# Patient Record
Sex: Male | Born: 1965 | Race: Black or African American | Hispanic: No | Marital: Single | Smoking: Current every day smoker
Health system: Southern US, Community
[De-identification: ages and names within clinical notes are randomized; demographics above are authoritative.]

## PROBLEM LIST (undated history)

## (undated) DIAGNOSIS — R200 Anesthesia of skin: Secondary | ICD-10-CM

## (undated) DIAGNOSIS — M5126 Other intervertebral disc displacement, lumbar region: Secondary | ICD-10-CM

## (undated) DIAGNOSIS — S12300A Unspecified displaced fracture of fourth cervical vertebra, initial encounter for closed fracture: Secondary | ICD-10-CM

## (undated) DIAGNOSIS — S72009A Fracture of unspecified part of neck of unspecified femur, initial encounter for closed fracture: Secondary | ICD-10-CM

## (undated) DIAGNOSIS — J189 Pneumonia, unspecified organism: Secondary | ICD-10-CM

## (undated) DIAGNOSIS — M545 Low back pain, unspecified: Secondary | ICD-10-CM

## (undated) DIAGNOSIS — R202 Paresthesia of skin: Secondary | ICD-10-CM

## (undated) DIAGNOSIS — M25569 Pain in unspecified knee: Secondary | ICD-10-CM

## (undated) DIAGNOSIS — G8929 Other chronic pain: Secondary | ICD-10-CM

## (undated) DIAGNOSIS — W108XXA Fall (on) (from) other stairs and steps, initial encounter: Secondary | ICD-10-CM

## (undated) HISTORY — PX: HAND TENDON SURGERY: SHX663

---

## 2003-01-06 ENCOUNTER — Emergency Department (HOSPITAL_COMMUNITY): Admission: EM | Admit: 2003-01-06 | Discharge: 2003-01-06 | Payer: Self-pay | Admitting: Emergency Medicine

## 2010-07-05 ENCOUNTER — Emergency Department (HOSPITAL_COMMUNITY): Admission: EM | Admit: 2010-07-05 | Discharge: 2010-07-05 | Payer: Self-pay | Admitting: Emergency Medicine

## 2010-07-14 ENCOUNTER — Emergency Department (HOSPITAL_COMMUNITY): Admission: EM | Admit: 2010-07-14 | Discharge: 2010-07-14 | Payer: Self-pay | Admitting: Emergency Medicine

## 2011-09-20 ENCOUNTER — Emergency Department (HOSPITAL_COMMUNITY): Payer: Self-pay

## 2011-09-20 ENCOUNTER — Emergency Department (HOSPITAL_COMMUNITY)
Admission: EM | Admit: 2011-09-20 | Discharge: 2011-09-20 | Disposition: A | Discharge status: Home / Self Care | Payer: Self-pay | Attending: Emergency Medicine | Admitting: Emergency Medicine

## 2011-09-20 ENCOUNTER — Encounter: Payer: Self-pay | Admitting: Emergency Medicine

## 2011-09-20 DIAGNOSIS — M25559 Pain in unspecified hip: Secondary | ICD-10-CM | POA: Insufficient documentation

## 2011-09-20 DIAGNOSIS — M549 Dorsalgia, unspecified: Secondary | ICD-10-CM

## 2011-09-20 DIAGNOSIS — R51 Headache: Secondary | ICD-10-CM | POA: Insufficient documentation

## 2011-09-20 DIAGNOSIS — W19XXXA Unspecified fall, initial encounter: Secondary | ICD-10-CM

## 2011-09-20 DIAGNOSIS — S0003XA Contusion of scalp, initial encounter: Secondary | ICD-10-CM | POA: Insufficient documentation

## 2011-09-20 DIAGNOSIS — T07XXXA Unspecified multiple injuries, initial encounter: Secondary | ICD-10-CM | POA: Insufficient documentation

## 2011-09-20 DIAGNOSIS — M542 Cervicalgia: Secondary | ICD-10-CM | POA: Insufficient documentation

## 2011-09-20 DIAGNOSIS — M545 Low back pain, unspecified: Secondary | ICD-10-CM | POA: Insufficient documentation

## 2011-09-20 DIAGNOSIS — S1093XA Contusion of unspecified part of neck, initial encounter: Secondary | ICD-10-CM | POA: Insufficient documentation

## 2011-09-20 DIAGNOSIS — S0093XA Contusion of unspecified part of head, initial encounter: Secondary | ICD-10-CM

## 2011-09-20 DIAGNOSIS — W1789XA Other fall from one level to another, initial encounter: Secondary | ICD-10-CM | POA: Insufficient documentation

## 2011-09-20 DIAGNOSIS — Y92009 Unspecified place in unspecified non-institutional (private) residence as the place of occurrence of the external cause: Secondary | ICD-10-CM | POA: Insufficient documentation

## 2011-09-20 DIAGNOSIS — K5730 Diverticulosis of large intestine without perforation or abscess without bleeding: Secondary | ICD-10-CM | POA: Insufficient documentation

## 2011-09-20 HISTORY — DX: Fracture of unspecified part of neck of unspecified femur, initial encounter for closed fracture: S72.009A

## 2011-09-20 LAB — POCT I-STAT, CHEM 8
BUN: 8 mg/dL (ref 6–23)
Chloride: 106 mEq/L (ref 96–112)
Glucose, Bld: 105 mg/dL — ABNORMAL HIGH (ref 70–99)
HCT: 43 % (ref 39.0–52.0)
Hemoglobin: 14.6 g/dL (ref 13.0–17.0)
Potassium: 4 mEq/L (ref 3.5–5.1)
Sodium: 144 mEq/L (ref 135–145)

## 2011-09-20 LAB — CBC
MCH: 30.8 pg (ref 26.0–34.0)
MCHC: 32.6 g/dL (ref 30.0–36.0)

## 2011-09-20 MED ORDER — ONDANSETRON HCL 4 MG/2ML IJ SOLN
4.0000 mg | Freq: Once | INTRAMUSCULAR | Status: AC
  Administered 2011-09-20: 4 mg via INTRAVENOUS
  Filled 2011-09-20: qty 2

## 2011-09-20 MED ORDER — MORPHINE SULFATE 4 MG/ML IJ SOLN
4.0000 mg | Freq: Once | INTRAMUSCULAR | Status: AC
  Administered 2011-09-20: 4 mg via INTRAVENOUS
  Filled 2011-09-20: qty 1

## 2011-09-20 MED ORDER — SODIUM CHLORIDE 0.9 % IV BOLUS (SEPSIS)
500.0000 mL | Freq: Once | INTRAVENOUS | Status: AC
  Administered 2011-09-20: 1000 mL via INTRAVENOUS

## 2011-09-20 MED ORDER — OXYCODONE-ACETAMINOPHEN 5-325 MG PO TABS: ORAL_TABLET | ORAL | Status: DC

## 2011-09-20 MED ORDER — CYCLOBENZAPRINE HCL 10 MG PO TABS
10.0000 mg | ORAL_TABLET | Freq: Once | ORAL | Status: AC
  Administered 2011-09-20: 10 mg via ORAL
  Filled 2011-09-20: qty 1

## 2011-09-20 MED ORDER — CYCLOBENZAPRINE HCL 10 MG PO TABS: 10.0000 mg | ORAL_TABLET | Freq: Three times a day (TID) | ORAL | Status: AC | PRN

## 2011-09-20 MED ORDER — OXYCODONE-ACETAMINOPHEN 5-325 MG PO TABS
2.0000 | ORAL_TABLET | Freq: Once | ORAL | Status: AC
  Administered 2011-09-20: 2 via ORAL
  Filled 2011-09-20: qty 2

## 2011-09-20 MED ORDER — IOHEXOL 300 MG/ML  SOLN
100.0000 mL | Freq: Once | INTRAMUSCULAR | Status: AC | PRN
  Administered 2011-09-20: 100 mL via INTRAVENOUS

## 2011-09-20 NOTE — ED Provider Notes (Signed)
History     CSN: [id number] Arrival date & time: 09/20/2011  2:38 PM   First MD Initiated Contact with Patient 09/20/11 1443      Chief Complaint  Patient presents with  . Fall    LSB  . Back Pain    (Consider location/radiation/quality/duration/timing/severity/associated sxs/prior treatment) HPI  The patient presents via EMS in full spine immobilization. He relates he was throwing something out of a pot and leaning over a banister on the porch and the porch railing gave way and he fell. He states he was leaning forward but as he fell he turned and he hit the right side of his head without loss of consciousness. He has pain in his right chest pelvis left hip and in his lower back. He denies numbness or tingling. He states when he tries to move his legs he gets a lot of pain in his back and in his pelvis. This happened just prior to arrival. No meds were given by EMS.   Past Medical History  Diagnosis Date  . Hip fracture     History reviewed. No pertinent past surgical history.  History reviewed. No pertinent family history.  History  Substance Use Topics  . Smoking status: Current Everyday Smoker  . Smokeless tobacco: Not on file  . Alcohol Use: Yes     weekly   patient is a Facilities manager    Review of Systems  All other systems reviewed and are negative.    Allergies  NKA  Home Medications  No current outpatient prescriptions on file.  BP 113/83  Pulse 104  Temp(Src) 97.6 F (36.4 C) (Oral)  Resp 18  Ht 6' (1.829 m)  Wt 160 lb (72.576 kg)  BMI 21.70 kg/m2  SpO2 98%  Vital signs show mild tachycardia  Physical Exam  Constitutional: He is oriented to person, place, and time. He appears well-developed and well-nourished.       Patient on a backboard which was removed during my exam. His c-collar was left in place  HENT:  Head: Normocephalic.       She has a large swelling in the right side of his head in the temporal area no laceration  or abrasion.  Eyes: Conjunctivae and EOM are normal. Pupils are equal, round, and reactive to light.  Neck:       Patient is in a c-collar.  Cardiovascular: Normal rate, regular rhythm and normal heart sounds.   Pulmonary/Chest: Effort normal and breath sounds normal. No respiratory distress. He has no wheezes. He has no rales. He exhibits tenderness.       Patient's very tender trace right rib cage area area he does not let me examine him there is no obvious abrasions or swelling. With my brief exam there is no obvious crepitance  Abdominal: Soft. Bowel sounds are normal.       Patient is very tender to stressing his pelvis he states it hurts on both sides.  Musculoskeletal:       Patient has pain in his left hip on attempted range of motion. He has no pain to palpation in his left knee lower leg ankle or foot he has no pain in his right leg however when he moves his right leg it makes him hurt in his back and pelvis. There is no external rotation or shortening of his left leg. Patient has no tenderness to palpation of the thoracic spine he does start having tenderness in the mid lumbar spine down but  gets progressively worse.  Neurological: He is alert and oriented to person, place, and time.  Skin: Skin is warm and dry.  Psychiatric: He has a normal mood and affect. His behavior is normal. Thought content normal.  PT is very painful to palpation and grabs my hands repeatedly, he also seems very painful when trying to get him off the backboard.  ED Course  Procedures (including critical care time)  Results for orders placed during the hospital encounter of 09/20/11  CBC      Component Value Range   WBC 5.2  4.0 - 10.5 (K/uL)   RBC 4.00 (*) 4.22 - 5.81 (MIL/uL)   Hemoglobin 12.3 (*) 13.0 - 17.0 (g/dL)   HCT 16.1 (*) 09.6 - 52.0 (%)   MCV 94.3  78.0 - 100.0 (fL)   MCH 30.8  26.0 - 34.0 (pg)   MCHC 32.6  30.0 - 36.0 (g/dL)   RDW 04.5 (*) 40.9 - 15.5 (%)   Platelets 221  150 - 400 (K/uL)    POCT I-STAT, CHEM 8      Component Value Range   Sodium 144  135 - 145 (mEq/L)   Potassium 4.0  3.5 - 5.1 (mEq/L)   Chloride 106  96 - 112 (mEq/L)   BUN 8  6 - 23 (mg/dL)   Creatinine, Ser 8.11  0.50 - 1.35 (mg/dL)   Glucose, Bld 914 (*) 70 - 99 (mg/dL)   Calcium, Ion 7.82 (*) 1.12 - 1.32 (mmol/L)   TCO2 22  0 - 100 (mmol/L)   Hemoglobin 14.6  13.0 - 17.0 (g/dL)   HCT 95.6  21.3 - 08.6 (%)   Laboratory impression mild anemia otherwise no significant changes  Dg Hip Complete Left  09/20/2011  *RADIOLOGY REPORT*  Clinical Data: Fall, left hip pain  LEFT HIP - COMPLETE 2+ VIEW  Comparison: None.  Findings: Three views of the left hip submitted.  No acute fracture or subluxation.  Round high density metallic foreign bodies are probable overlying the patient.  IMPRESSION: No acute fracture or subluxation.  Original Report Authenticated By: Natasha Mead, M.D.     09/20/2011  *RADIOLOGY REPORT*  Clinical Data:  45 year old male status post fall with pain.  CT CHEST, ABDOMEN AND PELVIS WITH CONTRAST  Technique:  Multidetector CT imaging of the chest, abdomen and pelvis was performed following the standard protocol during bolus administration of intravenous contrast.  Contrast: OMNIPAQUE IOHEXOL 300 MG/ML IV SOLN,  Comparison:  None.  CT CHEST  Findings:  Major airways are patent.  No pneumothorax, pleural effusion, or pericardial effusion.  Except for minor atelectasis and occasional calcified granulomas the lungs are clear.  Negative mediastinum.  Visualized major mediastinal vascular structures are within normal limits.  There is a posterior-lateral largely nondisplaced tenth rib fracture with periosteal new bone (series 3 image 51).  There is a chronic-appearing lateral left 12th rib fracture.  No acute rib fracture identified. There is a partially calcified left paracentral disc protrusion at T7-T8.  Less pronounced thoracic disc protrusions are noted elsewhere.  No thoracic vertebral fracture  sternum intact.  IMPRESSION: 1.  No acute traumatic injury identified in the chest. 2.  Subacute/partially healed right posterior tenth rib fracture. 3.  Abdomen, pelvis, and lumbar findings are below.  CT ABDOMEN AND PELVIS  Findings:  Bony pelvis appears intact.  SI joints within normal limits.  No pelvic free fluid.  Bladder distended.  Negative distal colon.  Occasional colonic diverticula.  Normal appendix.  No  dilated small bowel.  No pneumoperitoneum or free fluid in the abdomen.  The liver is remarkable for focal low attenuation adjacent to the falciform ligament measuring 12 mm (series 2 image 53).  Liver enhancement otherwise is normal.  Negative gallbladder (incidental phrygian cap), spleen, pancreas, adrenal glands, portal venous system, and major arterial structures.  Negative kidneys. Negative ureters.  Negative stomach and duodenum.  IMPRESSION: 1.  No acute traumatic injury identified in the abdomen or pelvis. Lumbar findings are below. 2.  Incidental focal hepatic steatosis adjacent to the falciform ligament.  CT LUMBAR SPINE WITH CONTRAST  Technique:  Multidetector CT imaging of the lumbar spine was performed with intravenous contrast administration.  Multiplanar CT image reconstructions were also generated.  Findings:  Bone mineralization is within normal limits.  Normal vertebral height and alignment. Bilateral posterior element alignment is within normal limits.  Sacrum intact.  Lumbar vertebrae intact.  T12-L1:  Negative.  L1-L2:  Negative.  L2-L3:  Right paracentral disc protrusion superimposed on circumferential disc bulge.  No significant spinal stenosis (AP thecal sac 10 mm).  No foraminal stenosis.  L3-L4:  Negative.  L4-L5:  Mild circumferential disc bulge.  Mild facet hypertrophy.  L5-S1:  Severe loss of disc space height and vacuum disc phenomena. Endplate sclerosis.  Circumferential disc osteophyte complex without spinal or lateral recess stenosis.  Mild facet hypertrophy. Mild to  moderate bilateral L5 foraminal stenosis primarily due to disc and endplate spurring.  IMPRESSION: 1.  No acute fracture or listhesis identified in the lumbar spine. 2.  Chronic severe L5-S1 disc degeneration with moderate bilateral L5 foraminal stenosis. 3.  L2-L3 disc degeneration including right paracentral disc protrusion without significant stenosis.  Original Report Authenticated By: Harley Hallmark, M.D.   Ct Cervical Spine Wo Contrast  09/20/2011  *RADIOLOGY REPORT*  Clinical Data:  45 year old male status post fall with posterior head and neck pain.  CT HEAD WITHOUT CONTRAST CT CERVICAL SPINE WITHOUT CONTRAST  Technique:  Multidetector CT imaging of the head and cervical spine was performed following the standard protocol without intravenous contrast.  Multiplanar CT image reconstructions of the cervical spine were also generated.  Comparison:  None.  CT HEAD  Findings: Right lateral scalp hematoma measures up to 6 mm in thickness.  Underlying calvarium intact. Visualized orbit soft tissues are within normal limits.  Mild ethmoid sinus mucosal thickening.  Other Visualized paranasal sinuses and mastoids are clear.  No acute osseous abnormality identified.  Cerebral volume is within normal limits for age.  No midline shift, ventriculomegaly, mass effect, evidence of mass lesion, intracranial hemorrhage or evidence of cortically based acute infarction.  Gray-white matter differentiation is within normal limits throughout the brain.  IMPRESSION: 1.  Right lateral scalp hematoma without underlying fracture. 2. Normal noncontrast CT appearance of the brain. 3.  Cervical findings are below.  CT CERVICAL SPINE  Findings: Lung apices are clear. Visualized paraspinal soft tissues are within normal limits.  Straightening of cervical lordosis. Visualized skull base is intact.  No atlanto-occipital dissociation.  Cervicothoracic junction alignment is within normal limits.  Bilateral posterior element alignment is  within normal limits.  Multilevel cervical disc space narrowing appears associated with circumferential disc bulges, without definite cervical spinal stenosis.  No acute cervical fracture.  IMPRESSION: No acute fracture or listhesis identified in the cervical spine. Ligamentous injury is not excluded.  Original Report Authenticated By: Harley Hallmark, M.D.    Patient was given IV fluids and IV pain medications. His c-collar was removed.  He is noted to be  ambulatory and  in no distress at time of discharge.    Diagnoses that have been ruled out:  Diagnoses that are still under consideration:  Final diagnoses:  Fall  Multiple contusions  Back pain  Contusion of head    Medications  Discharge medications  oxyCODONE-acetaminophen (PERCOCET) 5-325 MG per tablet 2 tablet (not administered)  cyclobenzaprine (FLEXERIL) tablet 10 mg (not administered)  cyclobenzaprine (FLEXERIL) 10 MG tablet (not administered)  oxyCODONE-acetaminophen (PERCOCET) 5-325 MG per tablet (not administered)    Medication given in ED  morphine 4 MG/ML injection 4 mg (4 mg Intravenous Given 09/20/11 1511)  ondansetron (ZOFRAN) injection 4 mg (4 mg Intravenous Given 09/20/11 1511)  sodium chloride 0.9 % bolus 500 mL (1000 mL Intravenous Given 09/20/11 1510)  morphine 4 MG/ML injection 4 mg (4 mg Intravenous Given 09/20/11 1614)  )    Devoria Albe, MD, Armando Gang   Ward Givens, MD 09/20/11 2130  Ward Givens, MD 09/20/11 2131

## 2011-09-20 NOTE — ED Notes (Signed)
Pt feeling little better. Transported to xray/ct at this time.

## 2011-09-20 NOTE — ED Notes (Signed)
Pt throwing pot over deck, approx 8 feet. Rail gave away and pt fell onto ground. Denies any LOC. Pt alert/oriented. Pupils perrla. Pt has tenderness to L leg from hip down and R side rib pain. Pain to head also from fall. Alert/oriented.

## 2011-09-20 NOTE — ED Provider Notes (Signed)
At time of discharge patient was noted to be ambulatory in his room in no distress. He states he was aware of the old rib fracture but he had not known for sure if he had a fracture at that time.  Ward Givens, MD 09/20/11 [phone]

## 2012-10-29 ENCOUNTER — Emergency Department (HOSPITAL_COMMUNITY): Payer: Self-pay

## 2012-10-29 ENCOUNTER — Encounter (HOSPITAL_COMMUNITY): Payer: Self-pay | Admitting: *Deleted

## 2012-10-29 ENCOUNTER — Emergency Department (HOSPITAL_COMMUNITY)
Admission: EM | Admit: 2012-10-29 | Discharge: 2012-10-29 | Payer: Self-pay | Attending: Emergency Medicine | Admitting: Emergency Medicine

## 2012-10-29 DIAGNOSIS — F17200 Nicotine dependence, unspecified, uncomplicated: Secondary | ICD-10-CM | POA: Insufficient documentation

## 2012-10-29 DIAGNOSIS — M5126 Other intervertebral disc displacement, lumbar region: Secondary | ICD-10-CM | POA: Insufficient documentation

## 2012-10-29 DIAGNOSIS — M549 Dorsalgia, unspecified: Secondary | ICD-10-CM | POA: Insufficient documentation

## 2012-10-29 HISTORY — DX: Other intervertebral disc displacement, lumbar region: M51.26

## 2012-10-29 MED ORDER — HYDROMORPHONE HCL PF 2 MG/ML IJ SOLN
2.0000 mg | Freq: Once | INTRAMUSCULAR | Status: AC
  Administered 2012-10-29: 2 mg via INTRAVENOUS
  Filled 2012-10-29: qty 1

## 2012-10-29 MED ORDER — DIAZEPAM 5 MG/ML IJ SOLN
5.0000 mg | Freq: Once | INTRAMUSCULAR | Status: AC
  Administered 2012-10-29: 5 mg via INTRAVENOUS
  Filled 2012-10-29: qty 2

## 2012-10-29 MED ORDER — HYDROMORPHONE HCL PF 1 MG/ML IJ SOLN
1.0000 mg | Freq: Once | INTRAMUSCULAR | Status: AC
  Administered 2012-10-29: 1 mg via INTRAVENOUS
  Filled 2012-10-29: qty 1

## 2012-10-29 MED ORDER — HYDROMORPHONE HCL PF 1 MG/ML IJ SOLN
1.0000 mg | Freq: Once | INTRAMUSCULAR | Status: AC
Start: 2012-10-29 — End: 2012-10-29
  Administered 2012-10-29: 1 mg via INTRAVENOUS
  Filled 2012-10-29: qty 1

## 2012-10-29 MED ORDER — OXYCODONE-ACETAMINOPHEN 5-325 MG PO TABS: 1.0000 | ORAL_TABLET | Freq: Four times a day (QID) | ORAL | Status: DC | PRN

## 2012-10-29 MED ORDER — PREDNISONE 10 MG PO TABS: 20.0000 mg | ORAL_TABLET | Freq: Two times a day (BID) | ORAL | Status: DC

## 2012-10-29 MED ORDER — KETOROLAC TROMETHAMINE 30 MG/ML IJ SOLN
30.0000 mg | Freq: Once | INTRAMUSCULAR | Status: AC
  Administered 2012-10-29: 30 mg via INTRAVENOUS
  Filled 2012-10-29: qty 1

## 2012-10-29 MED ORDER — MORPHINE SULFATE 4 MG/ML IJ SOLN
4.0000 mg | Freq: Once | INTRAMUSCULAR | Status: AC
  Administered 2012-10-29: 4 mg via INTRAVENOUS
  Filled 2012-10-29: qty 1

## 2012-10-29 NOTE — ED Notes (Signed)
Family at bedside. 

## 2012-10-29 NOTE — ED Notes (Signed)
Pt to MRI

## 2012-10-29 NOTE — ED Notes (Signed)
Pt asking for something to eat, explained to pt that he would not be allowed to have anything eat or drink until all results had been examined by Dr. Rock Nephew expressed understanding.

## 2012-10-29 NOTE — ED Notes (Addendum)
Pt was bending over to pick something up and heard a pop. Hx of back problems. Pt had Fentanyl by EMS en route.

## 2012-10-29 NOTE — ED Notes (Signed)
Xray called states that pt  Refuses to lay flat d/t pain.  Notified edp

## 2012-10-29 NOTE — ED Notes (Signed)
Patient states he does not need anything at this time. 

## 2012-10-29 NOTE — ED Provider Notes (Signed)
History   This chart was scribed for Geoffery Lyons, MD by Charolett Bumpers, ED Scribe. The patient was seen in room APA05/APA05. Patient's care was started at 1009.    CSN: [id number]  Arrival date & time 10/29/12  0944   First MD Initiated Contact with Patient 10/29/12 1009      Chief Complaint  Patient presents with  . Back Pain    The history is provided by the patient. No language interpreter was used.   BURHANUDDIN KOHLMANN is a 46 y.o. male who presents to the Emergency Department complaining of constant, severe, lower back pain with a sudden onset of this morning. He states that he leaned over at school, when he heard a popping noise in his lower back. He reports sudden movements aggravate his pain. He reports a h/o lumbar herniated disc 10 years ago with no complications since. He denies any urinary or bowel incontinence. He denies any weakness or numbness in extremities. He denies any recent increased in activity, heavy movement or injuries. He received Fentanyl 100 mcg by EMS en route.   Past Medical History  Diagnosis Date  . Hip fracture   . Lumbar herniated disc     Past Surgical History  Procedure Date  . Hand surgery     No family history on file.  History  Substance Use Topics  . Smoking status: Current Every Day Smoker  . Smokeless tobacco: Not on file  . Alcohol Use: Yes     Comment: weekly      Review of Systems  Gastrointestinal:       No bowel incontinence.   Genitourinary:       No urinary incontinence.   Musculoskeletal: Positive for back pain.  Neurological: Negative for weakness and numbness.  All other systems reviewed and are negative.    Allergies  Review of patient's allergies indicates no known allergies.  Home Medications  No current outpatient prescriptions on file.  BP 118/84  Pulse 76  Temp 98.7 F (37.1 C) (Oral)  Resp 20  SpO2 100%  Physical Exam  Nursing note and vitals reviewed. Constitutional: He is oriented  to person, place, and time. He appears well-developed and well-nourished. No distress.  HENT:  Head: Normocephalic and atraumatic.  Eyes: EOM are normal. Pupils are equal, round, and reactive to light.  Neck: Normal range of motion. Neck supple. No tracheal deviation present.  Cardiovascular: Normal rate, regular rhythm and normal heart sounds.   No murmur heard. Pulmonary/Chest: Effort normal and breath sounds normal. No respiratory distress. He has no wheezes.  Abdominal: Soft. Bowel sounds are normal. He exhibits no distension. There is no tenderness.  Musculoskeletal: Normal range of motion. He exhibits tenderness. He exhibits no edema.       Tenderness to palpation over bilaterally soft tissue in lumbar region. No bony tenderness. No step offs.  .   Neurological: He is alert and oriented to person, place, and time. He has normal reflexes.       Deep tendon reflexes are 2+ and equal in bilateral lower extremities. Strength 5/5 bilaterally in lower extremities  Skin: Skin is warm and dry.  Psychiatric: He has a normal mood and affect. His behavior is normal.    ED Course  Procedures (including critical care time)  DIAGNOSTIC STUDIES: Oxygen Saturation is 100% on room air, normal by my interpretation.    COORDINATION OF CARE:  10:33-Discussed planned course of treatment with the patient including pain medication and an x-ray  of lumbar spine, who is agreeable at this time.   10:45-Medication Orders: Ketorolac (Toradol) 30 mg/mL injection 30 mg-once; Morphine 4 mg/mL injection 4 mg-once; Diazepam (Valium) injection 5 mg-once.   Labs Reviewed - No data to display Dg Lumbar Spine Complete  10/29/2012  *RADIOLOGY REPORT*  Clinical Data: The patient's back popped.  Pain.  LUMBAR SPINE - COMPLETE 4+ VIEW  Comparison: CT abdomen and pelvis 09/20/2011.  Findings: Vertebral body height and alignment are maintained. There is loss of disc space height and endplate sclerosis at L5-S1 as seen on  the prior CT.  Paraspinous structures are unremarkable.  IMPRESSION: No acute finding.  L5-S1 degenerative disc disease.   Original Report Authenticated By: Holley Dexter, M.D.      No diagnosis found.    MDM  The patient presents with severe pain to the lower back after feeling a "pop" when he bent over to pick something up off the floor.  He came by ems because he was unable to stand and walk.  He received minimal relief with morphine, repeat doses of dilaudid, valium, and toradol.  His xrays were negative and he will have an mri done today.   This was completed and shows a small disc herniation at L2-L3 with annular tear, but nothing emergently surgical.  He will be discharged with prednisone and percocet.  He will be given the follow up information for neurosurgery.       I personally performed the services described in this documentation, which was scribed in my presence. The recorded information has been reviewed and is accurate.        Geoffery Lyons, MD 10/29/12 [phone]

## 2012-10-29 NOTE — ED Notes (Signed)
Pt back from MRI, reports some pain from movement onto the stretcher.

## 2012-12-08 ENCOUNTER — Encounter: Payer: Self-pay | Admitting: Physical Medicine & Rehabilitation

## 2012-12-24 ENCOUNTER — Ambulatory Visit (HOSPITAL_BASED_OUTPATIENT_CLINIC_OR_DEPARTMENT_OTHER): Payer: Worker's Compensation | Admitting: Physical Medicine & Rehabilitation

## 2012-12-24 ENCOUNTER — Encounter: Payer: Self-pay | Admitting: Physical Medicine & Rehabilitation

## 2012-12-24 ENCOUNTER — Encounter: Payer: Worker's Compensation | Attending: Physical Medicine & Rehabilitation

## 2012-12-24 ENCOUNTER — Telehealth: Payer: Self-pay | Admitting: *Deleted

## 2012-12-24 VITALS — BP 137/92 | HR 109 | Resp 14 | Ht 71.0 in | Wt 158.0 lb

## 2012-12-24 DIAGNOSIS — M545 Low back pain, unspecified: Secondary | ICD-10-CM

## 2012-12-24 DIAGNOSIS — M5136 Other intervertebral disc degeneration, lumbar region: Secondary | ICD-10-CM | POA: Insufficient documentation

## 2012-12-24 DIAGNOSIS — S336XXA Sprain of sacroiliac joint, initial encounter: Secondary | ICD-10-CM | POA: Insufficient documentation

## 2012-12-24 DIAGNOSIS — Z5181 Encounter for therapeutic drug level monitoring: Secondary | ICD-10-CM

## 2012-12-24 DIAGNOSIS — IMO0001 Reserved for inherently not codable concepts without codable children: Secondary | ICD-10-CM | POA: Insufficient documentation

## 2012-12-24 DIAGNOSIS — F17200 Nicotine dependence, unspecified, uncomplicated: Secondary | ICD-10-CM | POA: Insufficient documentation

## 2012-12-24 DIAGNOSIS — M51369 Other intervertebral disc degeneration, lumbar region without mention of lumbar back pain or lower extremity pain: Secondary | ICD-10-CM

## 2012-12-24 DIAGNOSIS — M5137 Other intervertebral disc degeneration, lumbosacral region: Secondary | ICD-10-CM

## 2012-12-24 MED ORDER — TRAMADOL-ACETAMINOPHEN 37.5-325 MG PO TABS: 1.0000 | ORAL_TABLET | Freq: Three times a day (TID) | ORAL | Status: DC | PRN

## 2012-12-24 MED ORDER — CYCLOBENZAPRINE HCL 5 MG PO TABS: 5.0000 mg | ORAL_TABLET | Freq: Every day | ORAL | Status: DC

## 2012-12-24 NOTE — Telephone Encounter (Signed)
Patient called wanting to know if Dr. Was going to refill his Oxycodone? He was not given a prescription during his OV today. Please advise.

## 2012-12-24 NOTE — Progress Notes (Signed)
Subjective:    Patient ID: Ross Oconnell, male    DOB: 12-16-1965, 47 y.o.   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 HPI Patient was treated and released from the emergency department on 10/29/2012.Prior history of lumbar disc 10 years ago noted by emergency department. Treated with Toradol and morphine injections as well as Valium injection. X-ray of the lumbar spine showed decreased disc height at L5-S1. No fractures. MRI also performed. Given prednisone and Percocet prescriptions MRI of the lumbar spine dated 10/29/2012 disc narrowing right paracentral extrusion L2-3. No findings at L3-4 or L4-5. Severe disc space loss L5-S1 with chronic appearing disc/osteophyte complex Next went to urgent care in [place] oh [facility]. They took him out of work until however the patient has been working Marine scientist. Also prescribed Medrol Dosepak as well as Flexeril. Currently taking ibuprofen only. Working light-duty 10 hours per week   Pain Inventory Average Pain 8 Pain Right Now 9 My pain is sharp  In the last 24 hours, has pain interfered with the following? General activity 6 Relation with others 4 Enjoyment of life 7 What TIME of day is your pain at its worst? morning and night Sleep (in general) Poor  Pain is worse with: walking, bending, sitting, standing and some activites Pain improves with: rest Relief from Meds: 8  Mobility walk without assistance how many minutes can you walk? 10 ability to climb steps?  yes do you drive?  no Do you have any goals in this area?  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  no  Physicians involved in your care Any changes since last visit?  no   Family History  Problem Relation Age of Onset  . Hypertension Mother   . Diabetes Mother    History   Social History  . Marital Status: Single    Spouse Name: N/A    Number of Children: N/A  . Years of Education: N/A   Social History Main Topics  . Smoking status: Current Every Day Smoker -- 0.5 packs/day    Types: Cigarettes  . Smokeless tobacco: Never Used  . Alcohol Use: Yes     Comment: weekly  . Drug Use: No  . Sexually Active: None   Other Topics Concern  . None   Social History Narrative  . None   Past Surgical History  Procedure Date  . Hand surgery    Past Medical History  Diagnosis Date  . Hip fracture   . Lumbar herniated disc    BP 137/92  Pulse 109  Resp 14  Ht 5\' 11"  (1.803 m)  Wt 158 lb (71.668 kg)  BMI 22.04 kg/m2  SpO2 99%    Review of Systems  Constitutional: Positive for fever, chills and diaphoresis.  Gastrointestinal: Positive for nausea.  Musculoskeletal: Positive for back pain and gait problem.  Neurological: Positive for dizziness and numbness.       Tingling, spasms   Psychiatric/Behavioral: Positive for dysphoric mood.  All other systems reviewed and are negative.       Objective:   Physical Exam  Vitals reviewed.  Constitutional: He is oriented to person, place, and time. He appears well-developed and well-nourished.  HENT:  Head: Atraumatic.  Eyes: Conjunctivae normal and EOM are normal. Pupils are equal, round, and reactive to light.  Musculoskeletal:       Lumbar back: He exhibits decreased range of motion and tenderness. He exhibits no deformity and no spasm.       Negative axial compression test Negative slump test Negative straight leg raising test Tenderness palpation starting at L4 paraspinals going down to S1 No tenderness around L2 area Range  of motion reduced to 50% No directional component  Neurological: He is alert and oriented to person, place, and time. He has normal strength and normal reflexes. He displays no atrophy. He exhibits normal muscle tone. Gait abnormal.       Move slowly but is able to go up on his toes as well as on his heels  Psychiatric: He has a normal mood and affect.    [name] is positive left PSIS      Assessment & Plan:  1. Low back and buttocks pain Exam Most consistent with sacroiliac disorder versus exacerbation of L5-S1 degenerative disc.Will refer to physical therapy for Improving range of motion,Activity tolerance as well as core stabilization. There is no pain at the L2-L3 area, do not feel that the annular tear is symptomatic even though on imaging studies it does appear to be more acute than the L5-S1 area Ultracet for pain, failing this trial of tramadol Do not think narcotic analgesics are indicated in this case May use Flexeril 5 mg at night May work with 20 pound lifting restriction , 25 pound pushing and pulling

## 2012-12-24 NOTE — Telephone Encounter (Signed)
Will not prescribe oxycodone We will use tramadol

## 2012-12-24 NOTE — Patient Instructions (Addendum)
Light duty restrictions no more than 20 pounds occasional lifting and carrying OK to push and pull 25 pounds Physical therapy 3 times per week Flexeril 5 mg at night Ultracet one tablet 3 times a day as needed for pain Recommend heating pad 30 minutes twice a day as needed

## 2012-12-28 NOTE — Telephone Encounter (Signed)
Tried to contact patient regarding oxycodone request.  Per Dr Wynn Banker he will not prescribe oxycodone and will use tramadol.

## 2013-01-07 ENCOUNTER — Ambulatory Visit: Payer: Worker's Compensation | Admitting: Physical Medicine & Rehabilitation

## 2013-01-13 ENCOUNTER — Ambulatory Visit: Payer: Worker's Compensation | Admitting: Physical Medicine & Rehabilitation

## 2013-01-28 ENCOUNTER — Ambulatory Visit: Payer: Worker's Compensation | Admitting: Physical Medicine & Rehabilitation

## 2013-02-25 ENCOUNTER — Encounter: Payer: Worker's Compensation | Attending: Physical Medicine & Rehabilitation

## 2013-02-25 ENCOUNTER — Ambulatory Visit: Payer: Worker's Compensation | Admitting: Physical Medicine & Rehabilitation

## 2013-03-20 ENCOUNTER — Emergency Department (HOSPITAL_COMMUNITY): Payer: Self-pay

## 2013-03-20 ENCOUNTER — Emergency Department (HOSPITAL_COMMUNITY)
Admission: EM | Admit: 2013-03-20 | Discharge: 2013-03-20 | Disposition: A | Discharge status: Home / Self Care | Payer: Self-pay | Attending: Emergency Medicine | Admitting: Emergency Medicine

## 2013-03-20 ENCOUNTER — Encounter (HOSPITAL_COMMUNITY): Payer: Self-pay | Admitting: *Deleted

## 2013-03-20 DIAGNOSIS — W19XXXA Unspecified fall, initial encounter: Secondary | ICD-10-CM | POA: Insufficient documentation

## 2013-03-20 DIAGNOSIS — F17200 Nicotine dependence, unspecified, uncomplicated: Secondary | ICD-10-CM | POA: Insufficient documentation

## 2013-03-20 DIAGNOSIS — S12300A Unspecified displaced fracture of fourth cervical vertebra, initial encounter for closed fracture: Secondary | ICD-10-CM

## 2013-03-20 DIAGNOSIS — Y929 Unspecified place or not applicable: Secondary | ICD-10-CM | POA: Insufficient documentation

## 2013-03-20 DIAGNOSIS — S129XXA Fracture of neck, unspecified, initial encounter: Secondary | ICD-10-CM

## 2013-03-20 DIAGNOSIS — W1809XA Striking against other object with subsequent fall, initial encounter: Secondary | ICD-10-CM | POA: Insufficient documentation

## 2013-03-20 DIAGNOSIS — Z79899 Other long term (current) drug therapy: Secondary | ICD-10-CM | POA: Insufficient documentation

## 2013-03-20 DIAGNOSIS — Z8739 Personal history of other diseases of the musculoskeletal system and connective tissue: Secondary | ICD-10-CM | POA: Insufficient documentation

## 2013-03-20 DIAGNOSIS — Z8781 Personal history of (healed) traumatic fracture: Secondary | ICD-10-CM | POA: Insufficient documentation

## 2013-03-20 DIAGNOSIS — Y9341 Activity, dancing: Secondary | ICD-10-CM | POA: Insufficient documentation

## 2013-03-20 HISTORY — DX: Unspecified displaced fracture of fourth cervical vertebra, initial encounter for closed fracture: S12.300A

## 2013-03-20 LAB — BASIC METABOLIC PANEL
BUN: 12 mg/dL (ref 6–23)
CO2: 27 mEq/L (ref 19–32)
Calcium: 8.4 mg/dL (ref 8.4–10.5)
Chloride: 106 mEq/L (ref 96–112)
Creatinine, Ser: 0.95 mg/dL (ref 0.50–1.35)

## 2013-03-20 LAB — CBC WITH DIFFERENTIAL/PLATELET
Basophils Absolute: 0 10*3/uL (ref 0.0–0.1)
Basophils Relative: 1 % (ref 0–1)
Eosinophils Absolute: 0.1 10*3/uL (ref 0.0–0.7)
HCT: 41.1 % (ref 39.0–52.0)
Hemoglobin: 13.8 g/dL (ref 13.0–17.0)
MCH: 30.3 pg (ref 26.0–34.0)
MCHC: 33.6 g/dL (ref 30.0–36.0)
Monocytes Absolute: 0.4 10*3/uL (ref 0.1–1.0)
Monocytes Relative: 8 % (ref 3–12)
Neutrophils Relative %: 45 % (ref 43–77)
RDW: 14.2 % (ref 11.5–15.5)

## 2013-03-20 MED ORDER — OXYCODONE-ACETAMINOPHEN 5-325 MG PO TABS: 1.0000 | ORAL_TABLET | Freq: Once | ORAL | Status: AC

## 2013-03-20 MED ORDER — OXYCODONE-ACETAMINOPHEN 5-325 MG PO TABS: 1.0000 | ORAL_TABLET | ORAL | Status: DC | PRN

## 2013-03-20 MED ORDER — OXYCODONE-ACETAMINOPHEN 5-325 MG PO TABS
ORAL_TABLET | ORAL | Status: AC
  Administered 2013-03-20: 1 via ORAL
  Filled 2013-03-20: qty 1

## 2013-03-20 NOTE — ED Provider Notes (Signed)
History     CSN: [id number]  Arrival date & time 03/20/13  0406   First MD Initiated Contact with Patient 03/20/13 0424      Chief Complaint  Patient presents with  . Fall  . Neck Pain    (Consider location/radiation/quality/duration/timing/severity/associated sxs/prior treatment) HPI Ross Oconnell is a 47 y.o. male brought in by ambulance, who presents to the Emergency Department complaining of neck pain after performing a "break dance" for a girlfriend. He felt his neck crack and he fell over and remained lying there for a couple of hours. He was given fentanyl by EMS. He is not in pain at this time. Denies numbness or tingling in any extremity.   Past Medical History  Diagnosis Date  . Hip fracture   . Lumbar herniated disc     Past Surgical History  Procedure Laterality Date  . Hand surgery      Family History  Problem Relation Age of Onset  . Hypertension Mother   . Diabetes Mother     History  Substance Use Topics  . Smoking status: Current Every Day Smoker -- 0.50 packs/day    Types: Cigarettes  . Smokeless tobacco: Never Used  . Alcohol Use: Yes     Comment: weekly      Review of Systems  Constitutional: Negative for fever.       10 Systems reviewed and are negative for acute change except as noted in the HPI.  HENT: Negative for congestion.   Eyes: Negative for discharge and redness.  Respiratory: Negative for cough and shortness of breath.   Cardiovascular: Negative for chest pain.  Gastrointestinal: Negative for vomiting and abdominal pain.  Musculoskeletal: Negative for back pain.       Neck pain  Skin: Negative for rash.  Neurological: Negative for syncope, numbness and headaches.  Psychiatric/Behavioral:       No behavior change.    Allergies  Review of patient's allergies indicates no known allergies.  Home Medications   Current Outpatient Rx  Name  Route  Sig  Dispense  Refill  . cyclobenzaprine (FLEXERIL) 5 MG tablet   Oral    Take 1 tablet (5 mg total) by mouth at bedtime.   30 tablet   0   . oxyCODONE-acetaminophen (PERCOCET/ROXICET) 5-325 MG per tablet   Oral   Take 1-2 tablets by mouth every 6 (six) hours as needed for pain.   25 tablet   0   . traMADol-acetaminophen (ULTRACET) 37.5-325 MG per tablet   Oral   Take 1 tablet by mouth every 8 (eight) hours as needed for pain.   90 tablet   0     BP 122/93  Pulse 97  Temp(Src) 97.8 F (36.6 C) (Oral)  Resp 18  Ht 6' (1.829 m)  Wt 160 lb (72.576 kg)  BMI 21.7 kg/m2  SpO2 97%  Physical Exam Physical examination: Vital signs and O2 SAT: Reviewed; Constitutional: Well developed, Well nourished, Well hydrated, In no acute distress; Head and Face: Normocephalic, Atraumatic No Battle's sign, no Raccoon eyes; Eyes: EOMI, PERRL, No scleral icterus; ENMT: Mouth and pharynx normal, Left TM normal, Right TM normal, Mucous membranes moist; Neck: Immobilized in C-collar, Trachea midline; Spine:  No midline CS, TS, LS tenderness.; Cardiovascular: Regular rate and rhythm, No murmur, rub, or gallop; Respiratory: Breath sounds clear & equal bilaterally, No rales, rhonchi, wheezes, or rub, Normal respiratory effort/excursion; Chest: Nontender, No deformity, Movement normal, No crepitus, No abrasions or ecchymosis.; Abdomen: Soft, Nontender,  Nondistended, Normal bowel sounds, No abrasions or ecchymosis.; Genitourinary: No CVA tenderness; Rectal: No blood at urethral meatus, no perineal hematoma, no gross rectal bleeding.; Extremities: No deformity, Full range of motion, Neurovascularly intact, Pulses normal, No tenderness, No edema, Pelvis stable; Neuro: AA&Ox3, Normal speech, GCS 15.  Major CN grossly intact.  No gross focal motor or sensory deficits in extremities.; Skin: Color normal, Warm, Dry  ED Course  Procedures (including critical care time) Results for orders placed during the hospital encounter of 03/20/13  CBC WITH DIFFERENTIAL      Result Value Range   WBC 4.9   4.0 - 10.5 K/uL   RBC 4.55  4.22 - 5.81 MIL/uL   Hemoglobin 13.8  13.0 - 17.0 g/dL   HCT 84.6  96.2 - 95.2 %   MCV 90.3  78.0 - 100.0 fL   MCH 30.3  26.0 - 34.0 pg   MCHC 33.6  30.0 - 36.0 g/dL   RDW 84.1  32.4 - 40.1 %   Platelets 186  150 - 400 K/uL   Neutrophils Relative 45  43 - 77 %   Neutro Abs 2.2  1.7 - 7.7 K/uL   Lymphocytes Relative 44  12 - 46 %   Lymphs Abs 2.2  0.7 - 4.0 K/uL   Monocytes Relative 8  3 - 12 %   Monocytes Absolute 0.4  0.1 - 1.0 K/uL   Eosinophils Relative 3  0 - 5 %   Eosinophils Absolute 0.1  0.0 - 0.7 K/uL   Basophils Relative 1  0 - 1 %   Basophils Absolute 0.0  0.0 - 0.1 K/uL    Ct Cervical Spine Wo Contrast  03/20/2013  *RADIOLOGY REPORT*  Clinical Data: The patient fell 1 hour ago.  Neck pain.  The patient is in C-collar.  CT CERVICAL SPINE WITHOUT CONTRAST  Technique:  Multidetector CT imaging of the cervical spine was performed. Multiplanar CT image reconstructions were also generated.  Comparison: CT cervical spine 09/20/2011  Findings: There is an acute fracture of the anterior inferior endplate of C4 with mild displacement and overlying soft tissue swelling. This is new since the previous study.  No involvement of the pedicles or posterior elements.  Straightening of the usual cervical lordosis which is likely due to patient positioning but ligamentous injury or muscle spasm can also have this appearance. No vertebral compression fractures.  Intervertebral disc space narrowing with endplate hypertrophic changes at C3-4, C4-5, C5-6, and C6-7 levels consistent with degenerative change.  Disc osteophyte complexes at multiple levels causing anterior effacement of the thecal sac.  Lateral masses of C1 appear symmetrical.  The odontoid process appears intact.  There is evidence of previous dental extractions.  Retention cyst in the left sphenoid sinus.  IMPRESSION: Acute appearing anterior corner fracture of the inferior endplate of C4 with overlying soft  tissue swelling.  Straightening of the usual cervical lordosis which may be due to patient positioning although ligamentous injury or muscle spasm can have this appearance.  Results were discussed by telephone with Dr. Colon Branch at 0546 hours on 03/20/2013.   Original Report Authenticated By: Burman Nieves, M.D.    Dg Cerv Spine Flex&ext Only  03/20/2013  *RADIOLOGY REPORT*  Clinical Data: Bilateral neck pain.  Anterior inferior corner fracture of C4 on CT.  CERVICAL SPINE - FLEXION AND EXTENSION VIEWS ONLY  Comparison: CT cervical spine 03/20/2013.  Findings: Acute appearing anterior inferior end plate fracture of C4 is demonstrated.  There is mild displacement  of fracture fragment and mild focal soft tissue swelling.  Changes are similar to those seen to previous CT scan.  Diffuse degenerative changes throughout cervical spine as previously identified.  No vertebral compression deformities.  Straightening of the usual cervical lordosis.  No significant change alignment between flexion and extension.  No evidence of instability.  IMPRESSION: Anterior inferior end plate fracture of C4.  Stable appearance of cervical spine alignment through flexion and extension.  No abnormal anterior subluxation to suggest instability.   Original Report Authenticated By: Burman Nieves, M.D.      [phone]  T/C from Dr. Zonia Kief, radiologist.He advised acute appearing anterior fraction of inferior endplate of C4. Suggested that these patient get MRI.  6:06 AM:  T/C to Dr. Yetta Barre, neurosurgeon case discussed, including:  HPI, pertinent PM/SHx, VS/PE, dx testing, ED course and treatment.  Asked him re MRI. He suggested we obtain flexion and extension films. If stable, place in collar for follow up with Dr. Yetta Barre in his office.   MDM  Patient with neck pain after trying to "break dance". Has a C4 endplate fracture. Stable with flexion and extension. Placed in collar. He is to follow up with Dr. Yetta Barre. Pt stable in ED with no  significant deterioration in condition.The patient appears reasonably screened and/or stabilized for discharge and I doubt any other medical condition or other [facility] requiring further screening, evaluation, or treatment in the ED at this time prior to discharge.  MDM Reviewed: nursing note and vitals Interpretation: labs, x-ray and CT scan           Nicoletta Dress. Colon Branch, MD 03/20/13 [phone]

## 2013-03-20 NOTE — ED Notes (Signed)
Pt fell while dancing against the wall. Pt laid there for about an hour then he sat up for a couple of hours denying help. Pt has had a few drinks earlier. Several months ago fell off a porch and hurt lower back and is prescribed oxycodone and flexeril.

## 2013-04-06 ENCOUNTER — Emergency Department (HOSPITAL_COMMUNITY): Payer: Self-pay

## 2013-04-06 ENCOUNTER — Encounter (HOSPITAL_COMMUNITY): Payer: Self-pay | Admitting: Emergency Medicine

## 2013-04-06 ENCOUNTER — Inpatient Hospital Stay (HOSPITAL_COMMUNITY)
Admission: EM | Admit: 2013-04-06 | Discharge: 2013-04-07 | DRG: 552 | Disposition: A | Discharge status: Home / Self Care | Payer: MEDICAID | Attending: General Surgery | Admitting: General Surgery

## 2013-04-06 DIAGNOSIS — M6281 Muscle weakness (generalized): Secondary | ICD-10-CM

## 2013-04-06 DIAGNOSIS — S239XXA Sprain of unspecified parts of thorax, initial encounter: Principal | ICD-10-CM | POA: Diagnosis present

## 2013-04-06 DIAGNOSIS — R404 Transient alteration of awareness: Secondary | ICD-10-CM | POA: Diagnosis present

## 2013-04-06 DIAGNOSIS — W108XXA Fall (on) (from) other stairs and steps, initial encounter: Secondary | ICD-10-CM

## 2013-04-06 DIAGNOSIS — F17200 Nicotine dependence, unspecified, uncomplicated: Secondary | ICD-10-CM | POA: Diagnosis present

## 2013-04-06 DIAGNOSIS — Z8781 Personal history of (healed) traumatic fracture: Secondary | ICD-10-CM

## 2013-04-06 HISTORY — DX: Unspecified displaced fracture of fourth cervical vertebra, initial encounter for closed fracture: S12.300A

## 2013-04-06 HISTORY — DX: Low back pain, unspecified: M54.50

## 2013-04-06 HISTORY — DX: Anesthesia of skin: R20.2

## 2013-04-06 HISTORY — DX: Low back pain: M54.5

## 2013-04-06 HISTORY — DX: Fall (on) (from) other stairs and steps, initial encounter: W10.8XXA

## 2013-04-06 HISTORY — DX: Pneumonia, unspecified organism: J18.9

## 2013-04-06 HISTORY — DX: Other chronic pain: G89.29

## 2013-04-06 HISTORY — DX: Paresthesia of skin: R20.0

## 2013-04-06 NOTE — ED Provider Notes (Signed)
MSE was initiated and I personally evaluated the patient and placed orders (if any) at  10:58 PM on Apr 06, 2013.  The patient appears stable so that the remainder of the MSE may be completed by another provider.  The paitnet has history of a cervial spine fracture after break dancing on 4/27.  He had another fall this evening. The patient was evaluated in another emergency room in [facility].   The patient apparently had a fall down a few stairs. He is currently complaining of pain in his back and inability to move his legs. Patient had CT scans of the head and neck as well as chest abdomen pelvis which were reportedly normal.    Dr Corliss Skains was contacted from general surgery.  Pt  Was transported for further evaluation.  On exam, pt is alert.  He complains of decreased sensation below the hips.  On exam is does have sensation in the GU region.  He complains of pain from the foley catheter.   Rectal tone is normal. No movement bilateral lower extremity.  Plan on plain films.  Dr Corliss Skains is reviewing his chart.  May need mri and possible neurosurgery consultation.   Celene Kras, MD 04/06/13 [phone]

## 2013-04-06 NOTE — H&P (Signed)
Ross Oconnell is an 47 y.o. male.   Chief Complaint: Fall/ no motor function or sensation in both lower extremities HPI: This is a 47 yo male who reportedly fell down a few steps earlier today.  Questionable loss of consciousness.  He was transported to [place], [place], where they noted that he had decreased motor function and sensation in his upper extremities, as well as absent motor function, sensation, and reflexes in both lower extremities.  He is complaining only of pain in his back.  He was seen in the ED at [facility] on 03/20/13 after break-dancing and hurting his neck.  He was diagnosed with an anterior end-plate fracture at C4.  Dr. Yetta Barre of Neurosurgery was consulted and the patient was discharged in an Aspen collar.  He decided to quit wearing the collar yesterday.    He has not been incontinent.  He actually complained of significant discomfort with Foley insertion.  He was given steroids prior to transfer.  In reviewing his medical records, he was evaluated for a fall and back pain on 10/29/12, as well as 09/20/11.    Past Medical History  Diagnosis Date  . Hip fracture   . Lumbar herniated disc     Past Surgical History  Procedure Laterality Date  . Hand surgery      Family History  Problem Relation Age of Onset  . Hypertension Mother   . Diabetes Mother    Social History:  reports that he has been smoking Cigarettes.  He has been smoking about 0.50 packs per day. He has never used smokeless tobacco. He reports that  drinks alcohol. He reports that he does not use illicit drugs. He admitted to the nurse that he had been drinking today prior to his fall, but he denied that to me.  Allergies: No Known Allergies  Prior to Admission medications   Medication Sig Start Date End Date Taking? Authorizing Provider  cyclobenzaprine (FLEXERIL) 5 MG tablet Take 1 tablet (5 mg total) by mouth at bedtime. 12/24/12   Erick Colace, MD  oxyCODONE-acetaminophen (PERCOCET/ROXICET)  5-325 MG per tablet Take 1 tablet by mouth every 4 (four) hours as needed for pain. 03/20/13   Nicoletta Dress. Colon Branch, MD  traMADol-acetaminophen (ULTRACET) 37.5-325 MG per tablet Take 1 tablet by mouth every 8 (eight) hours as needed for pain. 12/24/12   Erick Colace, MD    No results found for this or any previous visit (from the past 48 hour(s)). No results found.  Review of Systems  Eyes: Negative.   Musculoskeletal: Positive for back pain.  Neurological: Positive for weakness.  Endo/Heme/Allergies: Negative.     There were no vitals taken for this visit. Physical Exam  WDWN in NAD In hard cervical collar HEENT - no sign of external trauma/ PERRL/ EOMI Neck - non-tender; no masses or step-off Resp - CTA B CV - RRR Chest - non-tender; no palpable masses Abd - soft, non-tender Back - tender in upper lumbar spine Neuro - awake, alert Bilateral upper extremities - weak motor 2/4 bilateral; decreased sensation - unable to differentiate between dull and sharp up to the level of his shoulders Bilateral lower extremities - absent motor function; no sensation in lower extremities; absent reflexes  Studies from [facility] CT Head - no evidence of acute intracranial abnormality CT Cerv spine - negative for acute fracture CT Chest with contrast - no acute injury; possible right seventh rib fracture CT Abd/ Pelvis with contrast - no acute solid visceral  or bowel injury; no fracture seen  Labs pending  Assessment/Plan Fall - lumbar pain No radiologic sign of injury Significantly abnormal neurologic examination - lower extremities with absent motor and sensory function; weak upper extremities; pattern of neuro deficits does not fit a pattern  Plan:  Admit to Trauma service Consult Neurology to assist with neuro evaluation and further work-up.  Wilmon Arms. Corliss Skains, MD, [facility]  General/ Trauma Surgery    Fayette Hamada K. 04/06/2013, 11:34 PM

## 2013-04-06 NOTE — ED Notes (Addendum)
Pt states he fell 1-2 months ago and fractured his C4 but did not follow up. Pt fell again today into closet. Per [facility] pt cannot feel below his waste. Pt currently has 16 foley and is on 2L of o2 via nasal cannula. Pt has had CT of head down at [facility]. Pt states he has had alcohol today before falling. Pt rates pain in lower back 9/10 had of fentanyl in route. Received 1mg  of dilaudid, 4mg  of zofran and 1000mg  of prednisone at [facility].

## 2013-04-07 ENCOUNTER — Encounter (HOSPITAL_COMMUNITY): Payer: Self-pay | Admitting: General Practice

## 2013-04-07 DIAGNOSIS — R209 Unspecified disturbances of skin sensation: Secondary | ICD-10-CM

## 2013-04-07 LAB — BASIC METABOLIC PANEL
BUN: 8 mg/dL (ref 6–23)
Calcium: 8.7 mg/dL (ref 8.4–10.5)
Chloride: 101 mEq/L (ref 96–112)
Creatinine, Ser: 0.85 mg/dL (ref 0.50–1.35)
GFR calc Af Amer: >90 mL/min (ref >90)
GFR calc non Af Amer: >90 mL/min (ref >90)

## 2013-04-07 LAB — CBC WITH DIFFERENTIAL/PLATELET
Basophils Relative: 0 % (ref 0–1)
Eosinophils Absolute: 0 10*3/uL (ref 0.0–0.7)
Eosinophils Relative: 0 % (ref 0–5)
HCT: 45 % (ref 39.0–52.0)
Hemoglobin: 15.3 g/dL (ref 13.0–17.0)
MCH: 29.8 pg (ref 26.0–34.0)
MCHC: 34 g/dL (ref 30.0–36.0)
MCV: 87.7 fL (ref 78.0–100.0)
Monocytes Absolute: 0.1 10*3/uL (ref 0.1–1.0)
Monocytes Relative: 1 % — ABNORMAL LOW (ref 3–12)

## 2013-04-07 MED ORDER — ONDANSETRON HCL 4 MG/2ML IJ SOLN: 4.0000 mg | Freq: Four times a day (QID) | INTRAMUSCULAR | Status: DC | PRN

## 2013-04-07 MED ORDER — MORPHINE SULFATE 2 MG/ML IJ SOLN
2.0000 mg | INTRAMUSCULAR | Status: DC | PRN
  Administered 2013-04-07 (×4): 4 mg via INTRAVENOUS
  Filled 2013-04-07 (×5): qty 2

## 2013-04-07 MED ORDER — ONDANSETRON HCL 4 MG PO TABS: 4.0000 mg | ORAL_TABLET | Freq: Four times a day (QID) | ORAL | Status: DC | PRN

## 2013-04-07 MED ORDER — TRAMADOL HCL 50 MG PO TABS: 50.0000 mg | ORAL_TABLET | Freq: Four times a day (QID) | ORAL | Status: DC | PRN

## 2013-04-07 MED ORDER — PANTOPRAZOLE SODIUM 40 MG PO TBEC
40.0000 mg | DELAYED_RELEASE_TABLET | Freq: Every day | ORAL | Status: DC
  Administered 2013-04-07: 40 mg via ORAL
  Filled 2013-04-07: qty 1

## 2013-04-07 MED ORDER — SODIUM CHLORIDE 0.9 % IV SOLN
INTRAVENOUS | Status: DC
  Administered 2013-04-07: 02:00:00 via INTRAVENOUS

## 2013-04-07 MED ORDER — MORPHINE SULFATE 2 MG/ML IJ SOLN
2.0000 mg | Freq: Once | INTRAMUSCULAR | Status: AC
  Administered 2013-04-07: 2 mg via INTRAVENOUS
  Filled 2013-04-07: qty 1

## 2013-04-07 MED ORDER — IBUPROFEN 200 MG PO TABS: 400.0000 mg | ORAL_TABLET | Freq: Three times a day (TID) | ORAL | Status: DC | PRN

## 2013-04-07 MED ORDER — PANTOPRAZOLE SODIUM 40 MG IV SOLR
40.0000 mg | Freq: Every day | INTRAVENOUS | Status: DC
  Filled 2013-04-07: qty 40

## 2013-04-07 NOTE — Consult Note (Signed)
NEURO HOSPITALIST CONSULT NOTE    Reason for Consult: Acute paralysis with loss of sensation in both lower extremities.  HPI:                                                                                                                                          Ross Oconnell is an 47 y.o. male with a history of the hip fracture, herniated lumbar disc and recent anterior endplate fracture at C4 (03/20/2013), who suffered a fall while walking down stairs today and is complaining of pain in his neck as well as back pain and inability to move his lower extremities as well as loss of lower extremity sensation. C-spine CT scan today showed no signs of acute cervical fracture. Thoracic and lumbosacral spine x-rays show degenerative changes but no signs of acute fracture. Patient denies loss of sacral sensation, including no problems feeling insertion of his Foley catheter. He was given an Biochemist, clinical following his fracture on 03/20/2013, but decided to stop wearing the collar yesterday. Patient gives an equivocal history of alcohol consumption today. He has a history of alcohol abuse. His alcohol level on 427 2014 was 292.  Past Medical History  Diagnosis Date  . Hip fracture   . Lumbar herniated disc     Past Surgical History  Procedure Laterality Date  . Hand surgery      Family History  Problem Relation Age of Onset  . Hypertension Mother   . Diabetes Mother      Social History:  reports that he has been smoking Cigarettes.  He has been smoking about 0.50 packs per day. He has never used smokeless tobacco. He reports that  drinks alcohol. He reports that he does not use illicit drugs.  No Known Allergies  MEDICATIONS:                                                                                                                     Prior to Admission:  (Not in a hospital admission)    Blood pressure 123/83, pulse 93, temperature 97.8 F (36.6 C),  temperature source Oral, SpO2 98.00%.   Neurologic Examination:  Mental Status: Alert, oriented, in no apparent acute distress.  Speech fluent without evidence of aphasia.  Cranial Nerves: II-Visual fields were normal. III/IV/VI-Pupils were equal and reacted. Extraocular movements were full and conjugate.    V/VII-no facial numbness and no facial weakness. VIII-normal. X-normal speech. Motor: Normal strength and muscle tone of upper extremities proximally and distally. No effort was made to voluntarily move lower extremities. Muscle tone was normal in both lower extremities. Sensory: Subjective loss of sensation below the waist bilaterally. However, patient vehemently complained about penile pain associated with inadvertent movement of his Foley catheter. Deep Tendon Reflexes: 1+ and symmetrical in the upper extremities; 2+ at the knees and 1+ at the ankles, and symmetrical. Plantars: Mute bilaterally Cerebellar: Normal finger-to-nose testing.  No results found for this basename: cbc, bmp, coags, chol, tri, ldl, hga1c    No results found for this or any previous visit (from the past 48 hour(s)).  Dg Thoracic Spine W/swimmers  04/06/2013   *RADIOLOGY REPORT*  Clinical Data: Fall, low back pain.  THORACIC SPINE - 2 VIEW + SWIMMERS  Comparison: None.  Findings: Degenerative changes in the lower cervical spine and mid thoracic spine.  Normal alignment.  No fracture.  IMPRESSION: No acute bony abnormality.   Original Report Authenticated By: Charlett Nose, M.D.   Dg Lumbar Spine Complete  04/06/2013   *RADIOLOGY REPORT*  Clinical Data:  Fall, low back pain.  LUMBAR SPINE - COMPLETE 4+ VIEW  Comparison: MRI 10/29/2012.  Findings: Degenerative disc disease changes at L5-S1.  Normal alignment.  No fracture.  Degenerative facet disease at L5-S1.  SI joints are symmetric and unremarkable.  IMPRESSION:  Degenerative disc and facet disease at L5-S1.  No acute findings.   Original Report Authenticated By: Charlett Nose, M.D.    Assessment/Plan: No objective signs of acute spinal trauma clinically nor by imaging studies. Patient's presenting symptoms regarding lower extremity dysfunction appear to be psychophysiologic in origin.  Recommendations: Agree with conservative measures only at this point for management. If patient persists with lack of movement and complains of lack of sensation in lower extremities, MRI of thoracic and lumbar spine is recommended.  We will continue to follow this patient with you.  Venetia Maxon M.D. [facility] [phone]  04/07/2013, 12:04 AM

## 2013-04-07 NOTE — Progress Notes (Signed)
Patient needs a C-collar to go home.  Aspen collar.  Needs follow up with neurosurgeon as previously arranged.  This patient has been seen and I agree with the findings and treatment plan.  Marta Lamas. Gae Bon, MD, FACS [phone] (pager) [phone] (direct pager) Trauma Surgeon

## 2013-04-07 NOTE — Discharge Summary (Signed)
Physician Discharge Summary  Ross Oconnell OAC:[id number] DOB: 1966/03/23 DOA: 04/06/2013  PCP: No PCP Per Patient  Consultation: Dr. Leonette Most Stewart(Neurology)  Admit date: 04/06/2013 Discharge date: 04/07/2013  Recommendations for Outpatient Follow-up:  1.   Follow-up Information   Schedule an appointment as soon as possible for a visit with Dr. Gabriel Cirri.     Discharge Diagnoses:  1. Hx C4 fracture 2. Thoracic spine sprain   Surgical Procedure: none  Discharge Condition: stable Disposition: home with self care  Diet recommendation: regular  Filed Weights   04/07/13 0212  Weight: 149 lb 3.2 oz (67.677 kg)       Hospital Course:  Mr. Diemer was transferred from [place], [place] where he was noted to have decreased motor function and sensation to upper extremities, absent motor function and sensation to lower extremities following a fall while he was break dancing apparently.  The patient was complaining of discomfort from foley, so his complaints are inconsistent.  There was a question of loss of consciousness.  He was recently diagnosed with an anterior end plant fracture of C4.  Dr. Yetta Barre consulted for NUS at that time, he does not have a scheduled follow up.  He apparently stopped wearing c-collar yesterday.  The patient underwent a complete evaluation in [place], these records were reviewed.  He has been evaluated numerous times for back pain and falls.  His evaluation here at [facility] noted degenerative disc changes to L5-S1 without acute findings.  Neurology evaluated the patient, but did not find any objective findings and felt that perhaps his symptoms were psychophysiologic in origin.  Following this evaluation, sometimes overnight, the patient regained all motor and sensory functions.  When I entered his room, he was able to elevate, kick and even got up and walked stating he is ready to go home.  His foley was removed, he was able to void.  His VS remained stable.  The  patient was tolerating regular diet and drinking adequate fluids.  Aspen C-collar provided for the patient, I encouraged him to continue to wear until he follows up with Dr. Yetta Barre.  He may take otc analgesics for his pain along with ice/heat.  He was also provided with a prescription for tramadol which he may use as needed.  Warning signs that warrant immediate attention were reviewed with the patient.  He will be going home with his mother     Discharge Instructions     Medication List    TAKE these medications       ibuprofen 200 MG tablet  Commonly known as:  ADVIL  Take 2 tablets (400 mg total) by mouth 3 (three) times daily as needed for pain.           Follow-up Information   Schedule an appointment as soon as possible for a visit with Dr. Gabriel Cirri.       The results of significant diagnostics from this hospitalization (including imaging, microbiology, ancillary and laboratory) are listed below for reference.    Significant Diagnostic Studies: Dg Thoracic Spine W/swimmers  04/06/2013   *RADIOLOGY REPORT*  Clinical Data: Fall, low back pain.  THORACIC SPINE - 2 VIEW + SWIMMERS  Comparison: None.  Findings: Degenerative changes in the lower cervical spine and mid thoracic spine.  Normal alignment.  No fracture.  IMPRESSION: No acute bony abnormality.   Original Report Authenticated By: Charlett Nose, M.D.   Dg Lumbar Spine Complete  04/06/2013   *RADIOLOGY REPORT*  Clinical Data:  Fall, low back pain.  LUMBAR SPINE - COMPLETE 4+ VIEW  Comparison: MRI 10/29/2012.  Findings: Degenerative disc disease changes at L5-S1.  Normal alignment.  No fracture.  Degenerative facet disease at L5-S1.  SI joints are symmetric and unremarkable.  IMPRESSION: Degenerative disc and facet disease at L5-S1.  No acute findings.   Original Report Authenticated By: Charlett Nose, M.D.   Ct Cervical Spine Wo Contrast  03/20/2013   *RADIOLOGY REPORT*  Clinical Data: The patient fell 1 hour ago.   Neck pain.  The patient is in C-collar.  CT CERVICAL SPINE WITHOUT CONTRAST  Technique:  Multidetector CT imaging of the cervical spine was performed. Multiplanar CT image reconstructions were also generated.  Comparison: CT cervical spine 09/20/2011  Findings: There is an acute fracture of the anterior inferior endplate of C4 with mild displacement and overlying soft tissue swelling. This is new since the previous study.  No involvement of the pedicles or posterior elements.  Straightening of the usual cervical lordosis which is likely due to patient positioning but ligamentous injury or muscle spasm can also have this appearance. No vertebral compression fractures.  Intervertebral disc space narrowing with endplate hypertrophic changes at C3-4, C4-5, C5-6, and C6-7 levels consistent with degenerative change.  Disc osteophyte complexes at multiple levels causing anterior effacement of the thecal sac.  Lateral masses of C1 appear symmetrical.  The odontoid process appears intact.  There is evidence of previous dental extractions.  Retention cyst in the left sphenoid sinus.  IMPRESSION: Acute appearing anterior corner fracture of the inferior endplate of C4 with overlying soft tissue swelling.  Straightening of the usual cervical lordosis which may be due to patient positioning although ligamentous injury or muscle spasm can have this appearance.  Results were discussed by telephone with Dr. Colon Branch at 0546 hours on 03/20/2013.   Original Report Authenticated By: Burman Nieves, M.D.   Dg Cerv Spine Flex&ext Only  03/20/2013   *RADIOLOGY REPORT*  Clinical Data: Bilateral neck pain.  Anterior inferior corner fracture of C4 on CT.  CERVICAL SPINE - FLEXION AND EXTENSION VIEWS ONLY  Comparison: CT cervical spine 03/20/2013.  Findings: Acute appearing anterior inferior end plate fracture of C4 is demonstrated.  There is mild displacement of fracture fragment and mild focal soft tissue swelling.  Changes are similar to  those seen to previous CT scan.  Diffuse degenerative changes throughout cervical spine as previously identified.  No vertebral compression deformities.  Straightening of the usual cervical lordosis.  No significant change alignment between flexion and extension.  No evidence of instability.  IMPRESSION: Anterior inferior end plate fracture of C4.  Stable appearance of cervical spine alignment through flexion and extension.  No abnormal anterior subluxation to suggest instability.   Original Report Authenticated By: Burman Nieves, M.D.    Labs: Basic Metabolic Panel:  Recent Labs Lab 04/07/13 0010  NA 139  K 4.4  CL 101  CO2 21  GLUCOSE 114*  BUN 8  CREATININE 0.85  CALCIUM 8.7   CBC:  Recent Labs Lab 04/07/13 0010  WBC 5.8  NEUTROABS 4.9  HGB 15.3  HCT 45.0  MCV 87.7  PLT 239     Time coordinating discharge:  Signed:  Mercia Dowe, ANP-BC [phone]

## 2013-04-07 NOTE — Progress Notes (Signed)
Patient ID: Ross Oconnell, male   DOB: 1966/08/08, 47 y.o.   MRN: [id number]   LOS: 1 day   Subjective: Pt states he regained all mobility and sensation early this morning.  He is ready to go home and has a graduation today that he would like to attend.  Denies headaches, back pain resolved..  Denies paresthesias, weakness, slurred speech.  Has  Not been OOB yet, foley in place.  Objective: Vital signs in last 24 hours: Temp:  [97.4 F (36.3 C)-97.8 F (36.6 C)] 97.4 F (36.3 C) (05/15 0602) Pulse Rate:  [93-110] 95 (05/15 0602) Resp:  [16-18] 16 (05/15 0602) BP: (122-143)/(83-98) 122/90 mmHg (05/15 0602) SpO2:  [98 %-100 %] 100 % (05/15 0602) Weight:  [149 lb 3.2 oz (67.677 kg)] 149 lb 3.2 oz (67.677 kg) (05/15 0212) Last BM Date: 04/06/13  Lab Results:  CBC  Recent Labs  04/07/13 0010  WBC 5.8  HGB 15.3  HCT 45.0  PLT 239   BMET  Recent Labs  04/07/13 0010  NA 139  K 4.4  CL 101  CO2 21  GLUCOSE 114*  BUN 8  CREATININE 0.85  CALCIUM 8.7    Imaging: Dg Thoracic Spine W/swimmers  04/06/2013   *RADIOLOGY REPORT*  Clinical Data: Fall, low back pain.  THORACIC SPINE - 2 VIEW + SWIMMERS  Comparison: None.  Findings: Degenerative changes in the lower cervical spine and mid thoracic spine.  Normal alignment.  No fracture.  IMPRESSION: No acute bony abnormality.   Original Report Authenticated By: Charlett Nose, M.D.   Dg Lumbar Spine Complete  04/06/2013   *RADIOLOGY REPORT*  Clinical Data:  Fall, low back pain.  LUMBAR SPINE - COMPLETE 4+ VIEW  Comparison: MRI 10/29/2012.  Findings: Degenerative disc disease changes at L5-S1.  Normal alignment.  No fracture.  Degenerative facet disease at L5-S1.  SI joints are symmetric and unremarkable.  IMPRESSION: Degenerative disc and facet disease at L5-S1.  No acute findings.   Original Report Authenticated By: Charlett Nose, M.D.     PE:  General: WDWN in NAD  I  Neck - non-tender; no masses or step-off. Collar in place Resp  -CTA bilaterally.  No TTP CV - S1S2 RRR no murmurs, gallops or rubs.  No edema +2 bilateral distal pulses. Abd - soft, non-tender, +BS.  No masses or hernias. Back-no TTP Neuro - awake, alert and oriented.  Push and pulls are strong and equal bilaterally, sensation intact.    Assessment/Plan:  Patient Active Problem List   Diagnosis Date Noted  . Sacroiliac (ligament) sprain 12/24/2012  . Lumbar degenerative disc disease 12/24/2012   Thoracic and lumbar sprain -essentially negative exam.  His symptoms have resolved and pt is moving all extremities and ready for discharge.   DC foley and ambulate.  If able to void and does well ambulating will discharge home with PCP follow up 1-2 weeks.  Ashok Norris, ANP-BC Pager: [phone] General Trauma PA Pager: [phone]   04/07/2013

## 2013-07-09 ENCOUNTER — Emergency Department: Payer: Self-pay | Admitting: Emergency Medicine

## 2013-07-09 LAB — CBC
HCT: 38.8 % — ABNORMAL LOW (ref 40.0–52.0)
HGB: 13.4 g/dL (ref 13.0–18.0)
MCH: 31 pg (ref 26.0–34.0)
MCHC: 34.4 g/dL (ref 32.0–36.0)
MCV: 90 fL (ref 80–100)
RBC: 4.31 10*6/uL — ABNORMAL LOW (ref 4.40–5.90)
RDW: 16.2 % — ABNORMAL HIGH (ref 11.5–14.5)
WBC: 6.7 10*3/uL (ref 3.8–10.6)

## 2013-07-09 LAB — COMPREHENSIVE METABOLIC PANEL
Albumin: 3.6 g/dL (ref 3.4–5.0)
Alkaline Phosphatase: 85 U/L (ref 50–136)
Anion Gap: 6 — ABNORMAL LOW (ref 7–16)
BUN: 14 mg/dL (ref 7–18)
Calcium, Total: 8.4 mg/dL — ABNORMAL LOW (ref 8.5–10.1)
Chloride: 107 mmol/L (ref 98–107)
Creatinine: 0.94 mg/dL (ref 0.60–1.30)
EGFR (Non-African Amer.): >60
Osmolality: 279 (ref 275–301)
Sodium: 140 mmol/L (ref 136–145)
Total Protein: 7.2 g/dL (ref 6.4–8.2)

## 2013-07-09 LAB — DRUG SCREEN, URINE
Amphetamines, Ur Screen: NEGATIVE (ref ?–1000)
Barbiturates, Ur Screen: NEGATIVE (ref ?–200)
Cocaine Metabolite,Ur ~~LOC~~: NEGATIVE (ref ?–300)
MDMA (Ecstasy)Ur Screen: NEGATIVE (ref ?–500)
Phencyclidine (PCP) Ur S: NEGATIVE (ref ?–25)
Tricyclic, Ur Screen: NEGATIVE (ref ?–1000)

## 2013-07-09 LAB — ETHANOL: Ethanol %: 0.319 % (ref 0.000–0.080)

## 2013-07-20 ENCOUNTER — Encounter (HOSPITAL_COMMUNITY): Payer: Self-pay | Admitting: *Deleted

## 2013-07-20 ENCOUNTER — Emergency Department (HOSPITAL_COMMUNITY)
Admission: EM | Admit: 2013-07-20 | Discharge: 2013-07-21 | Disposition: A | Discharge status: Home / Self Care | Payer: Self-pay | Attending: Emergency Medicine | Admitting: Emergency Medicine

## 2013-07-20 DIAGNOSIS — G8929 Other chronic pain: Secondary | ICD-10-CM | POA: Insufficient documentation

## 2013-07-20 DIAGNOSIS — Z8701 Personal history of pneumonia (recurrent): Secondary | ICD-10-CM | POA: Insufficient documentation

## 2013-07-20 DIAGNOSIS — F17200 Nicotine dependence, unspecified, uncomplicated: Secondary | ICD-10-CM | POA: Insufficient documentation

## 2013-07-20 DIAGNOSIS — S0990XA Unspecified injury of head, initial encounter: Secondary | ICD-10-CM | POA: Insufficient documentation

## 2013-07-20 DIAGNOSIS — S161XXA Strain of muscle, fascia and tendon at neck level, initial encounter: Secondary | ICD-10-CM

## 2013-07-20 DIAGNOSIS — Z8781 Personal history of (healed) traumatic fracture: Secondary | ICD-10-CM | POA: Insufficient documentation

## 2013-07-20 DIAGNOSIS — R000 Tachycardia, unspecified: Secondary | ICD-10-CM | POA: Insufficient documentation

## 2013-07-20 DIAGNOSIS — IMO0002 Reserved for concepts with insufficient information to code with codable children: Secondary | ICD-10-CM | POA: Insufficient documentation

## 2013-07-20 DIAGNOSIS — S0031XA Abrasion of nose, initial encounter: Secondary | ICD-10-CM

## 2013-07-20 DIAGNOSIS — M549 Dorsalgia, unspecified: Secondary | ICD-10-CM

## 2013-07-20 DIAGNOSIS — Y929 Unspecified place or not applicable: Secondary | ICD-10-CM | POA: Insufficient documentation

## 2013-07-20 DIAGNOSIS — S139XXA Sprain of joints and ligaments of unspecified parts of neck, initial encounter: Secondary | ICD-10-CM | POA: Insufficient documentation

## 2013-07-20 DIAGNOSIS — Z8739 Personal history of other diseases of the musculoskeletal system and connective tissue: Secondary | ICD-10-CM | POA: Insufficient documentation

## 2013-07-20 DIAGNOSIS — W19XXXA Unspecified fall, initial encounter: Secondary | ICD-10-CM | POA: Insufficient documentation

## 2013-07-20 DIAGNOSIS — Y939 Activity, unspecified: Secondary | ICD-10-CM | POA: Insufficient documentation

## 2013-07-20 MED ORDER — MORPHINE SULFATE 4 MG/ML IJ SOLN
8.0000 mg | Freq: Once | INTRAMUSCULAR | Status: AC
  Administered 2013-07-21: 8 mg via INTRAMUSCULAR
  Filled 2013-07-20: qty 2

## 2013-07-20 MED ORDER — ONDANSETRON 4 MG PO TBDP
4.0000 mg | ORAL_TABLET | Freq: Once | ORAL | Status: AC
  Administered 2013-07-21: 4 mg via ORAL
  Filled 2013-07-20: qty 1

## 2013-07-21 ENCOUNTER — Emergency Department (HOSPITAL_COMMUNITY): Payer: Self-pay

## 2013-07-21 MED ORDER — OXYCODONE-ACETAMINOPHEN 5-325 MG PO TABS: 1.0000 | ORAL_TABLET | ORAL | Status: DC | PRN

## 2013-07-21 MED ORDER — TETANUS-DIPHTH-ACELL PERTUSSIS 5-2.5-18.5 LF-MCG/0.5 IM SUSP: 0.5000 mL | Freq: Once | INTRAMUSCULAR | Status: DC

## 2013-07-21 NOTE — ED Provider Notes (Signed)
CSN: [id number]     Arrival date & time 07/20/13  2238 History   First MD Initiated Contact with Patient 07/20/13 2317     Chief Complaint  Patient presents with  . Fall    .fell on brick ornament in the yard and abrasion to nose and numbness to left arm  . Back Pain  . Neck Injury    has passed hx of c4 injury and herniated disk per patient   (Consider location/radiation/quality/duration/timing/severity/associated sxs/prior Treatment) Patient is a 47 y.o. male presenting with fall, back pain, and neck injury. The history is provided by the patient.  Fall This is a new problem. The current episode started today. The problem has been gradually worsening. Associated symptoms include arthralgias, headaches, neck pain and numbness. Pertinent negatives include no abdominal pain, chest pain or coughing. Associated symptoms comments: Facial abrasions Back pain.. Nothing aggravates the symptoms. He has tried nothing for the symptoms. The treatment provided no relief.  Back Pain Associated symptoms: headaches and numbness   Associated symptoms: no abdominal pain, no chest pain and no dysuria   Neck Injury Associated symptoms include arthralgias, headaches, neck pain and numbness. Pertinent negatives include no abdominal pain, chest pain or coughing.    Past Medical History  Diagnosis Date  . Hip fracture ?1969    "don't remember which side" (04/07/2013)  . Lumbar herniated disc   . Numbness and tingling of both legs     "sometimes" (04/07/2013)  . Chronic lower back pain   . Pneumonia fall 2013  . C4 cervical fracture 03/20/2013    [name] 04/06/2013 (04/07/2013)  . Fall down steps 04/06/2013   Past Surgical History  Procedure Laterality Date  . Hand tendon surgery Right ~ 2011    "had tendons reattached" (04/07/2013)   Family History  Problem Relation Age of Onset  . Hypertension Mother   . Diabetes Mother    History  Substance Use Topics  . Smoking status: Current Every Day Smoker --  0.33 packs/day for 17 years    Types: Cigarettes  . Smokeless tobacco: Never Used     Comment: 04/07/2013 offered smoking cessation materials; pt declines  . Alcohol Use: 1.2 oz/week    2 Cans of beer per week     Comment: 04/07/2013 "one weekend day/week"    Review of Systems  Constitutional: Negative for activity change.       All ROS Neg except as noted in HPI  HENT: Positive for neck pain. Negative for nosebleeds.   Eyes: Negative for photophobia and discharge.  Respiratory: Negative for cough, shortness of breath and wheezing.   Cardiovascular: Negative for chest pain and palpitations.  Gastrointestinal: Negative for abdominal pain and blood in stool.  Genitourinary: Negative for dysuria, frequency and hematuria.  Musculoskeletal: Positive for back pain and arthralgias.  Skin: Negative.   Neurological: Positive for numbness and headaches. Negative for dizziness, seizures and speech difficulty.  Psychiatric/Behavioral: Negative for hallucinations and confusion.    Allergies  Review of patient's allergies indicates no known allergies.  Home Medications   Current Outpatient Rx  Name  Route  Sig  Dispense  Refill  . ibuprofen (ADVIL) 200 MG tablet   Oral   Take 2 tablets (400 mg total) by mouth 3 (three) times daily as needed for pain.   30 tablet   0   . traMADol (ULTRAM) 50 MG tablet   Oral   Take 1 tablet (50 mg total) by mouth every 6 (six) hours as needed  for pain.   50 tablet   0    BP 137/94  Pulse 109  Temp(Src) 98.1 F (36.7 C) (Oral)  Resp 20  Ht 6' (1.829 m)  Wt 160 lb (72.576 kg)  BMI 21.7 kg/m2  SpO2 96% Physical Exam  Nursing note and vitals reviewed. Constitutional: He is oriented to person, place, and time. He appears well-developed and well-nourished.  Non-toxic appearance.  HENT:  Head: Normocephalic.  Right Ear: Tympanic membrane and external ear normal.  Left Ear: Tympanic membrane and external ear normal.  Nose:    Smell of alcohol  present.  Eyes: EOM and lids are normal. Pupils are equal, round, and reactive to light.  Neck: Spinous process tenderness present. Carotid bruit is not present.  Cervical collar in place.  Cardiovascular: Regular rhythm, normal heart sounds, intact distal pulses and normal pulses.  Tachycardia present.   Pulmonary/Chest: Breath sounds normal. No respiratory distress.  Left lower rib area tenderness.  Abdominal: Soft. Bowel sounds are normal. There is no tenderness. There is no guarding.  Musculoskeletal: Normal range of motion.       Lumbar back: He exhibits tenderness and pain.       Right upper leg: Normal.       Left upper leg: Normal.  Back pain with movement of the left leg.  Lymphadenopathy:       Head (right side): No submandibular adenopathy present.       Head (left side): No submandibular adenopathy present.    He has no cervical adenopathy.  Neurological: He is alert and oriented to person, place, and time. He has normal strength. No cranial nerve deficit or sensory deficit.  Skin: Skin is warm and dry.  Psychiatric: He has a normal mood and affect. His speech is normal.    ED Course  Procedures (including critical care time) Labs Review Labs Reviewed - No data to display Imaging Review No results found. Pulse ox 96% on room air. WNL by my interpretation. MDM  No diagnosis found. **I have reviewed nursing notes, vital signs, and all appropriate lab and imaging results for this patient.*  Pt removed from LSB by me.  Rib xrays are negative for new fractures. Chest xray is non-acute. The L spine xray is neg for new fracture. DDD at L5 unchanged. 1:06am pt talking on cell phone. No acute distress. Pt request more pain med because back is still hurting . CT head and c spine pending. Pt's care continued by Dr Bebe Shaggy. 1:16am.  Kathie Dike, PA-C 07/21/13 0211

## 2013-07-21 NOTE — ED Provider Notes (Signed)
Medical screening examination/treatment/procedure(s) were conducted as a shared visit with non-physician practitioner(s) and myself.  I personally evaluated the patient during the encounter  Imaging negative for acute fracture, but given previous c-spine fx, will place in aspen collar and advised f/u with nsgy.  He is ambulatory and no focal weakness noted No abdominal tenderness noted He has abrasion to nose, but no deformity, no septal hematoma, and no other facial injury noted   Joya Gaskins, MD 07/21/13 [phone]

## 2013-10-25 DIAGNOSIS — Y9289 Other specified places as the place of occurrence of the external cause: Secondary | ICD-10-CM | POA: Insufficient documentation

## 2013-10-25 DIAGNOSIS — F17200 Nicotine dependence, unspecified, uncomplicated: Secondary | ICD-10-CM | POA: Insufficient documentation

## 2013-10-25 DIAGNOSIS — Y9389 Activity, other specified: Secondary | ICD-10-CM | POA: Insufficient documentation

## 2013-10-25 DIAGNOSIS — W2209XA Striking against other stationary object, initial encounter: Secondary | ICD-10-CM | POA: Insufficient documentation

## 2013-10-25 DIAGNOSIS — S92919A Unspecified fracture of unspecified toe(s), initial encounter for closed fracture: Secondary | ICD-10-CM | POA: Insufficient documentation

## 2013-10-26 ENCOUNTER — Emergency Department (HOSPITAL_COMMUNITY)
Admission: EM | Admit: 2013-10-26 | Discharge: 2013-10-26 | Disposition: A | Discharge status: Home / Self Care | Payer: Self-pay | Attending: Emergency Medicine | Admitting: Emergency Medicine

## 2013-10-26 ENCOUNTER — Encounter (HOSPITAL_COMMUNITY): Payer: Self-pay | Admitting: Emergency Medicine

## 2013-10-26 ENCOUNTER — Emergency Department (HOSPITAL_COMMUNITY): Payer: Self-pay

## 2013-10-26 DIAGNOSIS — S92912A Unspecified fracture of left toe(s), initial encounter for closed fracture: Secondary | ICD-10-CM

## 2013-10-26 MED ORDER — IBUPROFEN 800 MG PO TABS
800.0000 mg | ORAL_TABLET | Freq: Once | ORAL | Status: AC
  Administered 2013-10-26: 800 mg via ORAL
  Filled 2013-10-26: qty 1

## 2013-10-26 MED ORDER — HYDROCODONE-ACETAMINOPHEN 5-325 MG PO TABS: 1.0000 | ORAL_TABLET | Freq: Four times a day (QID) | ORAL | Status: DC | PRN

## 2013-10-26 MED ORDER — HYDROCODONE-ACETAMINOPHEN 5-325 MG PO TABS
1.0000 | ORAL_TABLET | Freq: Once | ORAL | Status: AC
  Administered 2013-10-26: 1 via ORAL
  Filled 2013-10-26: qty 1

## 2013-10-26 MED ORDER — IBUPROFEN 800 MG PO TABS: 800.0000 mg | ORAL_TABLET | Freq: Three times a day (TID) | ORAL | Status: DC

## 2013-10-26 NOTE — ED Provider Notes (Signed)
Medical screening examination/treatment/procedure(s) were performed by non-physician practitioner and as supervising physician I was immediately available for consultation/collaboration.  EKG Interpretation   None       Devoria Albe, MD, Armando Gang   Ward Givens, MD 10/26/13 [phone]

## 2013-10-26 NOTE — ED Provider Notes (Signed)
CSN: [id number]     Arrival date & time 10/25/13  2359 History   First MD Initiated Contact with Patient 10/26/13 0009     Chief Complaint  Patient presents with  . Foot Pain   (Consider location/radiation/quality/duration/timing/severity/associated sxs/prior Treatment) Patient is a 47 y.o. male presenting with lower extremity pain. The history is provided by the patient.  Foot Pain This is a new problem. The current episode started yesterday. The problem occurs constantly. The problem has been gradually worsening. Pertinent negatives include no abdominal pain, arthralgias, chest pain, coughing, neck pain or numbness. Associated symptoms comments: Swelling left foot. The symptoms are aggravated by standing and walking. He has tried nothing for the symptoms. The treatment provided no relief.    History reviewed. No pertinent past medical history. History reviewed. No pertinent past surgical history. No family history on file. History  Substance Use Topics  . Smoking status: Current Every Day Smoker    Types: Cigarettes  . Smokeless tobacco: Not on file  . Alcohol Use: Yes    Review of Systems  Constitutional: Negative for activity change.       All ROS Neg except as noted in HPI  HENT: Negative for nosebleeds.   Eyes: Negative for photophobia and discharge.  Respiratory: Negative for cough, shortness of breath and wheezing.   Cardiovascular: Negative for chest pain and palpitations.  Gastrointestinal: Negative for abdominal pain and blood in stool.  Genitourinary: Negative for dysuria, frequency and hematuria.  Musculoskeletal: Negative for arthralgias, back pain and neck pain.  Skin: Negative.   Neurological: Negative for dizziness, seizures, speech difficulty and numbness.  Psychiatric/Behavioral: Negative for hallucinations and confusion.    Allergies  Review of patient's allergies indicates no known allergies.  Home Medications  No current outpatient prescriptions on  file. BP 125/88  Pulse 96  Temp(Src) 97.8 F (36.6 C) (Oral)  Resp 20  Ht 6' (1.829 m)  Wt 160 lb (72.576 kg)  BMI 21.70 kg/m2  SpO2 99% Physical Exam  Nursing note and vitals reviewed. Constitutional: He is oriented to person, place, and time. He appears well-developed and well-nourished.  Non-toxic appearance.  HENT:  Head: Normocephalic.  Right Ear: Tympanic membrane and external ear normal.  Left Ear: Tympanic membrane and external ear normal.  Eyes: EOM and lids are normal. Pupils are equal, round, and reactive to light.  Neck: Normal range of motion. Neck supple. Carotid bruit is not present.  Cardiovascular: Normal rate, regular rhythm, normal heart sounds, intact distal pulses and normal pulses.   Pulmonary/Chest: Breath sounds normal. No respiratory distress.  Abdominal: Soft. Bowel sounds are normal. There is no tenderness. There is no guarding.  Musculoskeletal: Normal range of motion.  FROM of the left hip, knee, and ankle. Swelling of the dorsum of the 2nd, 3rd, and 4th toe area. The 2nd toe is painful to touch. The other areas are sore to palpation. No lesions between the toes. DP pulse 2+  Lymphadenopathy:       Head (right side): No submandibular adenopathy present.       Head (left side): No submandibular adenopathy present.    He has no cervical adenopathy.  Neurological: He is alert and oriented to person, place, and time. He has normal strength. No cranial nerve deficit or sensory deficit.  Skin: Skin is warm and dry.  Psychiatric: He has a normal mood and affect. His speech is normal.    ED Course  Procedures (including critical care time) Labs Review Labs Reviewed -  No data to display Imaging Review Dg Foot Complete Left  10/26/2013   CLINICAL DATA:  Left great toe pain and swelling after trauma  EXAM: LEFT FOOT - COMPLETE 3+ VIEW  COMPARISON:  None.  FINDINGS: There is an acute minimally displaced oblique fracture traversing the mid shaft of the 2nd  proximal phalanx. No intra-articular extension identified. Probable posttraumatic deformity noted about the 3rd and 4th metatarsals. No other acute fracture or dislocation identified. Mild degenerative osteoarthrosis noted at the 1st MTP joint.  Curvilinear metallic retained foreign body is noted within the plantar soft tissues inferior to the anterior portion of the calcaneus. This finding is of uncertain acuity. Soft tissues are otherwise unremarkable.  IMPRESSION: 1. Acute minimally displaced oblique fracture of the 2nd proximal phalanx. 2. Curvilinear retained metallic foreign body within the plantar soft tissues of the left hindfoot, of uncertain acuity. 3. Probable remote posttraumatic deformity of the 4th and 5th metatarsals. 4. Mild degenerative osteoarthrosis about the 1st MTP joint.   Electronically Signed   By: Rise Mu M.D.   On: 10/26/2013 00:59    EKG Interpretation   None       MDM  No diagnosis found. *I have reviewed nursing notes, vital signs, and all appropriate lab and imaging results for this patient.**  Xray of the left foot reveals a mid shaft fracture of the 2nd prox. Phalanx. There is posttraumatic deformity of the 3rd and 4th metatarsals, believed to be remote. Pt fitted with Lenora Boys splint and post-op shoe. Pt advised to see Dr Hilda Lias for additional evaluation and management. Rx for norco and ibuprofen given to the patient.  Kathie Dike, PA-C 10/26/13 [phone]

## 2013-10-26 NOTE — ED Notes (Signed)
Pt states he hit his foot on some furniture in floor yesterday.

## 2013-10-26 NOTE — ED Notes (Signed)
Pt alert & oriented x4. Patient given discharge instructions, paperwork & prescription(s). Patient verbalized understanding. Pt left department w/ no further questions.  

## 2015-03-05 ENCOUNTER — Encounter (HOSPITAL_COMMUNITY): Payer: Self-pay | Admitting: *Deleted

## 2016-04-12 ENCOUNTER — Emergency Department (HOSPITAL_COMMUNITY)
Admission: EM | Admit: 2016-04-12 | Discharge: 2016-04-12 | Disposition: A | Discharge status: Home / Self Care | Payer: Self-pay | Attending: Emergency Medicine | Admitting: Emergency Medicine

## 2016-04-12 ENCOUNTER — Encounter (HOSPITAL_COMMUNITY): Payer: Self-pay | Admitting: *Deleted

## 2016-04-12 ENCOUNTER — Emergency Department (HOSPITAL_COMMUNITY): Payer: Self-pay

## 2016-04-12 DIAGNOSIS — F17210 Nicotine dependence, cigarettes, uncomplicated: Secondary | ICD-10-CM | POA: Insufficient documentation

## 2016-04-12 DIAGNOSIS — M25562 Pain in left knee: Secondary | ICD-10-CM | POA: Insufficient documentation

## 2016-04-12 HISTORY — DX: Other chronic pain: G89.29

## 2016-04-12 HISTORY — DX: Pain in unspecified knee: M25.569

## 2016-04-12 MED ORDER — NAPROXEN 250 MG PO TABS
250.0000 mg | ORAL_TABLET | Freq: Once | ORAL | Status: AC
  Administered 2016-04-12: 250 mg via ORAL
  Filled 2016-04-12: qty 1

## 2016-04-12 MED ORDER — NAPROXEN 250 MG PO TABS: 250.0000 mg | ORAL_TABLET | Freq: Two times a day (BID) | ORAL | Status: DC | PRN

## 2016-04-12 NOTE — ED Notes (Signed)
Patient transported to X-ray 

## 2016-04-12 NOTE — ED Notes (Signed)
Pt c/o left knee pain and swelling for 2 weeks.

## 2016-04-12 NOTE — ED Provider Notes (Signed)
CSN: [id number]     Arrival date & time 04/12/16  0341 History   First MD Initiated Contact with Patient 04/12/16 [phone]     Chief Complaint  Patient presents with  . Knee Pain      HPI Pt was seen at 0350. Per pt, c/o gradual onset and persistence of constant acute flair of his chronic left knee "pain" for the past 2 weeks. Denies injury, no fevers, no rash, no focal motor weakness, no tingling/numbness in extremities, no back pain.    Past Medical History  Diagnosis Date  . Hip fracture (HCC) ?1969    "don't remember which side" (04/07/2013)  . Lumbar herniated disc   . Numbness and tingling of both legs     "sometimes" (04/07/2013)  . Chronic lower back pain   . Pneumonia fall 2013  . C4 cervical fracture (HCC) 03/20/2013    [name]/notes 04/06/2013 (04/07/2013)  . Fall down steps 04/06/2013  . Chronic knee pain    Past Surgical History  Procedure Laterality Date  . Hand tendon surgery Right ~ 2011    "had tendons reattached" (04/07/2013)   Family History  Problem Relation Age of Onset  . Hypertension Mother   . Diabetes Mother    Social History  Substance Use Topics  . Smoking status: Current Every Day Smoker -- 0.33 packs/day for 17 years    Types: Cigarettes  . Smokeless tobacco: None     Comment: 04/07/2013 offered smoking cessation materials; pt declines  . Alcohol Use: 1.2 oz/week    2 Cans of beer per week     Comment: 04/07/2013 "one weekend day/week"    Review of Systems ROS: Statement: All systems negative except as marked or noted in the HPI; Constitutional: Negative for fever and chills. ; ; Eyes: Negative for eye pain, redness and discharge. ; ; ENMT: Negative for ear pain, hoarseness, nasal congestion, sinus pressure and sore throat. ; ; Cardiovascular: Negative for chest pain, palpitations, diaphoresis, dyspnea and peripheral edema. ; ; Respiratory: Negative for cough, wheezing and stridor. ; ; Gastrointestinal: Negative for nausea, vomiting, diarrhea, abdominal pain,  blood in stool, hematemesis, jaundice and rectal bleeding. . ; ; Genitourinary: Negative for dysuria, flank pain and hematuria. ; ; Musculoskeletal: +chronic left knee pain. Negative for back pain and neck pain. Negative for trauma.; ; Skin: Negative for pruritus, rash, abrasions, blisters, bruising and skin lesion.; ; Neuro: Negative for headache, lightheadedness and neck stiffness. Negative for weakness, altered level of consciousness, altered mental status, extremity weakness, paresthesias, involuntary movement, seizure and syncope.      Allergies  Review of patient's allergies indicates no known allergies.  Home Medications   Prior to Admission medications   Medication Sig Start Date End Date Taking? Authorizing Provider  HYDROcodone-acetaminophen (NORCO) 5-325 MG per tablet Take 1 tablet by mouth every 6 (six) hours as needed for moderate pain. 10/26/13   Ivery QualeHobson Bryant, PA-C  ibuprofen (ADVIL) 200 MG tablet Take 2 tablets (400 mg total) by mouth 3 (three) times daily as needed for pain. 04/07/13   Emina Riebock, NP  ibuprofen (ADVIL,MOTRIN) 800 MG tablet Take 1 tablet (800 mg total) by mouth 3 (three) times daily. 10/26/13   Ivery QualeHobson Bryant, PA-C  oxyCODONE-acetaminophen (PERCOCET/ROXICET) 5-325 MG per tablet Take 1 tablet by mouth every 4 (four) hours as needed for pain. 07/21/13   Zadie Rhineonald Wickline, MD  traMADol (ULTRAM) 50 MG tablet Take 1 tablet (50 mg total) by mouth every 6 (six) hours as needed for pain.  04/07/13   Emina Riebock, NP   BP 135/94 mmHg  Pulse 112  Temp(Src) 97.9 F (36.6 C) (Oral)  Resp 18  Ht  (1.803 m)  Wt 155 lb (70.308 kg)  BMI 21.63 kg/m2  SpO2 92% Physical Exam  0355: Physical examination:  Nursing notes reviewed; Vital signs and O2 SAT reviewed;  Constitutional: Well developed, Well nourished, Well hydrated, In no acute distress; Head:  Normocephalic, atraumatic; Eyes: EOMI, PERRL, No scleral icterus; ENMT: Mouth and pharynx normal, Mucous membranes moist;  Neck: Supple, Full range of motion, No lymphadenopathy; Cardiovascular: Regular rate and rhythm, No murmur, rub, or gallop; Respiratory: Breath sounds clear & equal bilaterally, No rales, rhonchi, wheezes.  Speaking full sentences with ease, Normal respiratory effort/excursion; Chest: Nontender, Movement normal; Abdomen: Soft, Nontender, Nondistended, Normal bowel sounds; Genitourinary: No CVA tenderness; Extremities: Pulses normal, +FROM left knee, including able to lift extended LLE off stretcher, and extend left lower leg against resistance.  No ligamentous laxity.  No patellar or quad tendon step-offs.  NMS intact left foot, strong pedal pp. +plantarflexion of left foot w/calf squeeze.  No palpable gap left Achilles's tendon.  No proximal fibular head tenderness.  No edema, erythema, warmth, ecchymosis or deformity.  +generalized TTP without specific area of point tenderness.  No deformity. No calf tenderness, edema or asymmetry.; Neuro: AA&Ox3, Major CN grossly intact.  Speech clear. No gross focal motor or sensory deficits in extremities.; Skin: Color normal, Warm, Dry.   ED Course  Procedures (including critical care time) Labs Review  Imaging Review  I have personally reviewed and evaluated these images and lab results as part of my medical decision-making.   EKG Interpretation None      MDM  MDM Reviewed: previous chart, nursing note and vitals Interpretation: x-ray      Dg Knee Complete 4 Views Left 04/12/2016  CLINICAL DATA:  Left knee pain for 2 weeks. Swelling. No known injury. EXAM: LEFT KNEE - COMPLETE 4+ VIEW COMPARISON:  None. FINDINGS: No evidence of fracture, dislocation, or joint effusion. No evidence of arthropathy or other focal bone abnormality. Soft tissues are unremarkable. Old ununited ossicles over the tibial tubercle. IMPRESSION: No acute bony abnormalities. Electronically Signed   By: Burman Nieves M.D.   On: 04/12/2016 04:37    0440:  XR reassuring. Tx  symptomatically, f/u Ortho MD. Dx and testing d/w pt and family.  Questions answered.  Verb understanding, agreeable to d/c home with outpt f/u.   Samuel Jester, DO 04/15/16 1821

## 2016-04-12 NOTE — Discharge Instructions (Signed)
Take the prescription as directed.  Apply moist heat or ice to the area(s) of discomfort, for 15 minutes at a time, several times per day for the next few days.  Do not fall asleep on a heating or ice pack. Wear the knee immobilizer and use the crutches to walk until you are seen in follow up. Call the Orthopedic doctor on Monday to schedule a follow up appointment this week.  Return to the Emergency Department immediately if worsening.

## 2016-09-26 ENCOUNTER — Encounter (HOSPITAL_COMMUNITY): Payer: Self-pay | Admitting: Emergency Medicine

## 2016-09-26 ENCOUNTER — Emergency Department (HOSPITAL_COMMUNITY): Payer: Self-pay

## 2016-09-26 ENCOUNTER — Emergency Department (HOSPITAL_COMMUNITY)
Admission: EM | Admit: 2016-09-26 | Discharge: 2016-09-26 | Disposition: A | Discharge status: Home / Self Care | Payer: Self-pay | Attending: Emergency Medicine | Admitting: Emergency Medicine

## 2016-09-26 DIAGNOSIS — W1789XA Other fall from one level to another, initial encounter: Secondary | ICD-10-CM | POA: Insufficient documentation

## 2016-09-26 DIAGNOSIS — M25552 Pain in left hip: Secondary | ICD-10-CM | POA: Insufficient documentation

## 2016-09-26 DIAGNOSIS — Y929 Unspecified place or not applicable: Secondary | ICD-10-CM | POA: Insufficient documentation

## 2016-09-26 DIAGNOSIS — S8392XA Sprain of unspecified site of left knee, initial encounter: Secondary | ICD-10-CM

## 2016-09-26 DIAGNOSIS — F17210 Nicotine dependence, cigarettes, uncomplicated: Secondary | ICD-10-CM | POA: Insufficient documentation

## 2016-09-26 DIAGNOSIS — Z791 Long term (current) use of non-steroidal anti-inflammatories (NSAID): Secondary | ICD-10-CM | POA: Insufficient documentation

## 2016-09-26 DIAGNOSIS — W19XXXA Unspecified fall, initial encounter: Secondary | ICD-10-CM

## 2016-09-26 DIAGNOSIS — Z79899 Other long term (current) drug therapy: Secondary | ICD-10-CM | POA: Insufficient documentation

## 2016-09-26 DIAGNOSIS — S335XXA Sprain of ligaments of lumbar spine, initial encounter: Secondary | ICD-10-CM | POA: Insufficient documentation

## 2016-09-26 DIAGNOSIS — S83402A Sprain of unspecified collateral ligament of left knee, initial encounter: Secondary | ICD-10-CM | POA: Insufficient documentation

## 2016-09-26 DIAGNOSIS — Y939 Activity, unspecified: Secondary | ICD-10-CM | POA: Insufficient documentation

## 2016-09-26 DIAGNOSIS — Y999 Unspecified external cause status: Secondary | ICD-10-CM | POA: Insufficient documentation

## 2016-09-26 MED ORDER — HYDROCODONE-ACETAMINOPHEN 5-325 MG PO TABS
1.0000 | ORAL_TABLET | Freq: Once | ORAL | Status: AC
  Administered 2016-09-26: 1 via ORAL
  Filled 2016-09-26: qty 1

## 2016-09-26 MED ORDER — HYDROCODONE-ACETAMINOPHEN 5-325 MG PO TABS: ORAL_TABLET | ORAL | 0 refills | Status: AC

## 2016-09-26 MED ORDER — IBUPROFEN 800 MG PO TABS
800.0000 mg | ORAL_TABLET | Freq: Once | ORAL | Status: AC
  Administered 2016-09-26: 800 mg via ORAL
  Filled 2016-09-26: qty 1

## 2016-09-26 MED ORDER — IBUPROFEN 800 MG PO TABS: 800.0000 mg | ORAL_TABLET | Freq: Three times a day (TID) | ORAL | 0 refills | Status: AC

## 2016-09-26 NOTE — ED Triage Notes (Signed)
[facility]. Complaining of back pain, left hip pain

## 2016-09-26 NOTE — ED Notes (Signed)
Pt able to transport from ems stretcher to wheelchair on his own

## 2016-09-26 NOTE — Discharge Instructions (Signed)
Elevate and apply ice packs on and off to your knee. Use crutches for weightbearing. Call one of the orthopedic doctors listed to arrange a follow-up appointment.

## 2016-09-29 NOTE — ED Provider Notes (Signed)
[facility]-EMERGENCY DEPT Provider Note   CSN: [id number] Arrival date & time: 09/26/16  1330     History   Chief Complaint Chief Complaint  Patient presents with  . Fall    HPI Ross Oconnell is a 50 y.o. male.  HPI   Ross Oconnell is a 50 y.o. male who presents to the Emergency Department complaining of Pain to his left hip and middle to lower back. He states that he was standing on a porch when the floor collapsed. He states that he fell approximate 4-5 feet down through the opening. Since that time, he complains of sharp stabbing pain from his back that radiates into his left hip and associated left knee pain.  Pain is worse with weightbearing. He denies head injury, neck pain, numbness or weakness of the lower extremities, urine or bowel changes. He has not taken any medication or tried any therapies prior to arrival.  Past Medical History:  Diagnosis Date  . C4 cervical fracture (HCC) 03/20/2013   [name]/notes 04/06/2013 (04/07/2013)  . Chronic knee pain   . Chronic lower back pain   . Fall down steps 04/06/2013  . Hip fracture (HCC) ?1969   "don't remember which side" (04/07/2013)  . Lumbar herniated disc   . Numbness and tingling of both legs    "sometimes" (04/07/2013)  . Pneumonia fall 2013    Patient Active Problem List   Diagnosis Date Noted  . Sacroiliac (ligament) sprain 12/24/2012  . Lumbar degenerative disc disease 12/24/2012    Past Surgical History:  Procedure Laterality Date  . HAND TENDON SURGERY Right ~ 2011   "had tendons reattached" (04/07/2013)       Home Medications    Prior to Admission medications   Medication Sig Start Date End Date Taking? Authorizing Provider  HYDROcodone-acetaminophen (NORCO/VICODIN) 5-325 MG tablet Take one tab po q 4-6 hrs prn pain 09/26/16   Archie Shea, PA-C  ibuprofen (ADVIL,MOTRIN) 800 MG tablet Take 1 tablet (800 mg total) by mouth 3 (three) times daily. Take with food 09/26/16   Pauline Ausammy Jamiya Nims, PA-C    Family  History Family History  Problem Relation Age of Onset  . Hypertension Mother   . Diabetes Mother     Social History Social History  Substance Use Topics  . Smoking status: Current Every Day Smoker    Packs/day: 0.33    Years: 17.00    Types: Cigarettes  . Smokeless tobacco: Not on file     Comment: 04/07/2013 offered smoking cessation materials; pt declines  . Alcohol use 1.2 oz/week    2 Cans of beer per week     Comment: 04/07/2013 "one weekend day/week"     Allergies   Patient has no known allergies.   Review of Systems Review of Systems  Constitutional: Negative for fever.  Respiratory: Negative for shortness of breath.   Gastrointestinal: Negative for abdominal pain, constipation and vomiting.  Genitourinary: Negative for decreased urine volume, difficulty urinating, dysuria, flank pain and hematuria.  Musculoskeletal: Positive for arthralgias (Left hip and knee pain) and back pain. Negative for joint swelling.  Skin: Negative for rash.  Neurological: Negative for dizziness, syncope, weakness and numbness.  All other systems reviewed and are negative.    Physical Exam Updated Vital Signs BP 111/79 (BP Location: Left Arm)   Pulse 103   Temp 98.5 F (36.9 C) (Oral)   Resp 16   Ht 5\' 11"  (1.803 m)   Wt 72.6 kg   SpO2 100%   BMI  22.32 kg/m   Physical Exam  Constitutional: He is oriented to person, place, and time. He appears well-developed and well-nourished. No distress.  HENT:  Head: Normocephalic and atraumatic.  Neck: Normal range of motion. Neck supple.  Cardiovascular: Normal rate, regular rhythm and intact distal pulses.   No murmur heard. Pulmonary/Chest: Effort normal and breath sounds normal. No respiratory distress.  Abdominal: Soft. He exhibits no distension. There is no tenderness. There is no guarding.  Musculoskeletal: He exhibits tenderness. He exhibits no edema.       Lumbar back: He exhibits tenderness and pain. He exhibits normal range  of motion, no swelling, no deformity, no laceration and normal pulse.  ttp of the left lower lumbar paraspinal muscles.  No spinal tenderness.  DP pulses are brisk and symmetrical.  Distal sensation intact.  Pt has 5/5 strength against resistance of bilateral lower extremities.  Diffuse tenderness of the anterior left knee with mild edema present. No open wounds, no obvious effusion.   Neurological: He is alert and oriented to person, place, and time. He has normal strength. No sensory deficit. He exhibits normal muscle tone. Coordination and gait normal.  Reflex Scores:      Patellar reflexes are 2+ on the right side and 2+ on the left side.      Achilles reflexes are 2+ on the right side and 2+ on the left side. Skin: Skin is warm and dry. No rash noted.  Psychiatric: He has a normal mood and affect.  Nursing note and vitals reviewed.    ED Treatments / Results  Labs (all labs ordered are listed, but only abnormal results are displayed) Labs Reviewed - No data to display  EKG  EKG Interpretation None       Radiology Dg Lumbar Spine Complete  Result Date: 09/26/2016 CLINICAL DATA:  Patient fell through a port 20 collapse today. Patient is reporting low back pain in generalize left knee pain. EXAM: LUMBAR SPINE - COMPLETE 4+ VIEW COMPARISON:  Lumbar spine series of July 20, 2013 FINDINGS: The lumbar vertebral bodies are preserved in height. There is degenerative disc space narrowing at L5-S1 which is stable. There is mild stable loss of height at L2-3 as well. There is no spondylolisthesis. The pedicles and transverse processes are intact. The observed portions of the sacrum are normal. IMPRESSION: There is no acute bony abnormality of the lumbar spine. There is mild degenerative disc space narrowing at L2-3 and moderate narrowing at L5-S1. These findings are stable. Electronically Signed   By: David  SwazilandJordan M.D.   On: 09/26/2016 15:24   Dg Knee Complete 4 Views Left  Result Date:  09/26/2016 CLINICAL DATA:  Patient fell through a porch today when it collapse. The patient reports low back and left knee pain. EXAM: LEFT KNEE - COMPLETE 4+ VIEW COMPARISON:  None in PACs FINDINGS: The bones are subjectively adequately mineralized. There is no acute fracture nor dislocation. The tibial plateaus are normal. The joint spaces are reasonably well-maintained. There may be a small suprapatellar effusion. There is no significant osteophyte formation. IMPRESSION: There is no acute bony abnormality of the left knee. There may be a small suprapatellar effusion. Electronically Signed   By: David  [place] M.D.   On: 09/26/2016 15:25   Dg Hip Unilat With Pelvis 2-3 Views Left  Result Date: 09/26/2016 CLINICAL DATA:  Left hip pain history of fall EXAM: DG HIP (WITH OR WITHOUT PELVIS) 2-3V LEFT COMPARISON:  09/20/2011 FINDINGS: There is no dislocation evident with  femoral heads normal in position. The pubic symphysis appears intact. 5.3 cm calcific opacity within the soft tissues medial to the proximal cortex of left femur, could relate to prior avulsion injury of the lesser trochanter. IMPRESSION: 1. No dislocation evident 2. Large calcific opacity over the medial soft tissues of the proximal thigh suggestive of avulsion injury of lesser trochanter, likely remote. If concern for acute injury, MRI may be considered. Electronically Signed   By: Jasmine Pang M.D.   On: 09/26/2016 14:21     Procedures Procedures (including critical care time)  Medications Ordered in ED Medications  HYDROcodone-acetaminophen (NORCO/VICODIN) 5-325 MG per tablet 1 tablet (1 tablet Oral Given 09/26/16 1525)  ibuprofen (ADVIL,MOTRIN) tablet 800 mg (800 mg Oral Given 09/26/16 1525)     Initial Impression / Assessment and Plan / ED Course  I have reviewed the triage vital signs and the nursing notes.  Pertinent labs & imaging results that were available during my care of the patient were reviewed by me and considered in  my medical decision making (see chart for details).  Clinical Course     Patient well-appearing. Vital signs are stable. X-rays are negative for fracture. Patient remains NV intact. Symptoms likely related to sprain of the knee and low back.  Patient is ambulated in the department and talking to friends who are also here for evaluation for the same. He agrees to symptomatic treatment and orthopedic follow-up if needed. Ace wrap applied to left knee   Final Clinical Impressions(s) / ED Diagnoses   Final diagnoses:  Fall, initial encounter  Sprain of left knee, unspecified ligament, initial encounter  Lumbar sprain, initial encounter  Hip pain, left    New Prescriptions Discharge Medication List as of 09/26/2016  4:37 PM    START taking these medications   Details  HYDROcodone-acetaminophen (NORCO/VICODIN) 5-325 MG tablet Take one tab po q 4-6 hrs prn pain, Print    ibuprofen (ADVIL,MOTRIN) 800 MG tablet Take 1 tablet (800 mg total) by mouth 3 (three) times daily. Take with food, Starting Fri 09/26/2016, Print         Belina Mandile [place], [place] 09/29/16 1610    Loren Racer, MD 09/29/16 [phone]

## 2021-01-12 ENCOUNTER — Other Ambulatory Visit: Payer: Self-pay

## 2021-01-12 ENCOUNTER — Encounter (HOSPITAL_COMMUNITY): Payer: Self-pay

## 2021-01-12 ENCOUNTER — Emergency Department (HOSPITAL_COMMUNITY): Payer: Self-pay

## 2021-01-12 ENCOUNTER — Emergency Department (HOSPITAL_COMMUNITY)
Admission: EM | Admit: 2021-01-12 | Discharge: 2021-01-13 | Disposition: A | Discharge status: Home / Self Care | Payer: Self-pay | Attending: Emergency Medicine | Admitting: Emergency Medicine

## 2021-01-12 DIAGNOSIS — S40212A Abrasion of left shoulder, initial encounter: Secondary | ICD-10-CM | POA: Insufficient documentation

## 2021-01-12 DIAGNOSIS — S0081XA Abrasion of other part of head, initial encounter: Secondary | ICD-10-CM | POA: Insufficient documentation

## 2021-01-12 DIAGNOSIS — R45850 Homicidal ideations: Secondary | ICD-10-CM | POA: Insufficient documentation

## 2021-01-12 DIAGNOSIS — F1010 Alcohol abuse, uncomplicated: Secondary | ICD-10-CM | POA: Insufficient documentation

## 2021-01-12 DIAGNOSIS — F1094 Alcohol use, unspecified with alcohol-induced mood disorder: Secondary | ICD-10-CM

## 2021-01-12 DIAGNOSIS — F17210 Nicotine dependence, cigarettes, uncomplicated: Secondary | ICD-10-CM | POA: Insufficient documentation

## 2021-01-12 DIAGNOSIS — F10929 Alcohol use, unspecified with intoxication, unspecified: Secondary | ICD-10-CM

## 2021-01-12 LAB — COMPREHENSIVE METABOLIC PANEL
ALT: 22 U/L (ref 0–44)
AST: 34 U/L (ref 15–41)
Albumin: 4.2 g/dL (ref 3.5–5.0)
Alkaline Phosphatase: 98 U/L (ref 38–126)
Anion gap: 8 (ref 5–15)
BUN: 9 mg/dL (ref 6–20)
CO2: 22 mmol/L (ref 22–32)
Calcium: 8.7 mg/dL — ABNORMAL LOW (ref 8.9–10.3)
Chloride: 111 mmol/L (ref 98–111)
Creatinine, Ser: 0.77 mg/dL (ref 0.61–1.24)
GFR, Estimated: >60 mL/min (ref >60)
Glucose, Bld: 70 mg/dL (ref 70–99)
Potassium: 3.7 mmol/L (ref 3.5–5.1)
Sodium: 141 mmol/L (ref 135–145)
Total Bilirubin: 1 mg/dL (ref 0.3–1.2)
Total Protein: 7.5 g/dL (ref 6.5–8.1)

## 2021-01-12 LAB — CBC
HCT: 45.5 % (ref 39.0–52.0)
Hemoglobin: 14.7 g/dL (ref 13.0–17.0)
MCH: 29.1 pg (ref 26.0–34.0)
MCHC: 32.3 g/dL (ref 30.0–36.0)
MCV: 90.1 fL (ref 80.0–100.0)
Platelets: 235 10*3/uL (ref 150–400)
RBC: 5.05 MIL/uL (ref 4.22–5.81)
RDW: 14.7 % (ref 11.5–15.5)
WBC: 9.4 10*3/uL (ref 4.0–10.5)
nRBC: 0 % (ref 0.0–0.2)

## 2021-01-12 LAB — SALICYLATE LEVEL: Salicylate Lvl: <7 mg/dL — ABNORMAL LOW (ref 7.0–30.0)

## 2021-01-12 LAB — ACETAMINOPHEN LEVEL: Acetaminophen (Tylenol), Serum: <10 ug/mL — ABNORMAL LOW (ref 10–30)

## 2021-01-12 LAB — ETHANOL: Alcohol, Ethyl (B): 325 mg/dL (ref <10)

## 2021-01-12 MED ORDER — LORAZEPAM 1 MG PO TABS: 0.0000 mg | ORAL_TABLET | Freq: Two times a day (BID) | ORAL | Status: DC

## 2021-01-12 MED ORDER — ZIPRASIDONE MESYLATE 20 MG IM SOLR
20.0000 mg | Freq: Once | INTRAMUSCULAR | Status: AC
  Administered 2021-01-12: 20 mg via INTRAMUSCULAR
  Filled 2021-01-12: qty 20

## 2021-01-12 MED ORDER — LORAZEPAM 2 MG/ML IJ SOLN: 0.0000 mg | Freq: Four times a day (QID) | INTRAMUSCULAR | Status: DC

## 2021-01-12 MED ORDER — THIAMINE HCL 100 MG/ML IJ SOLN: 100.0000 mg | Freq: Every day | INTRAMUSCULAR | Status: DC

## 2021-01-12 MED ORDER — LORAZEPAM 2 MG/ML IJ SOLN: 0.0000 mg | Freq: Two times a day (BID) | INTRAMUSCULAR | Status: DC

## 2021-01-12 MED ORDER — LORAZEPAM 1 MG PO TABS: 0.0000 mg | ORAL_TABLET | Freq: Four times a day (QID) | ORAL | Status: DC

## 2021-01-12 MED ORDER — GABAPENTIN 100 MG PO CAPS
200.0000 mg | ORAL_CAPSULE | Freq: Two times a day (BID) | ORAL | Status: DC
  Administered 2021-01-13: 200 mg via ORAL
  Filled 2021-01-12: qty 2

## 2021-01-12 MED ORDER — THIAMINE HCL 100 MG PO TABS
100.0000 mg | ORAL_TABLET | Freq: Every day | ORAL | Status: DC
Start: 2021-01-12 — End: 2021-01-13
  Administered 2021-01-13: 100 mg via ORAL
  Filled 2021-01-12: qty 1

## 2021-01-12 NOTE — ED Notes (Signed)
Pt asleep.

## 2021-01-12 NOTE — ED Notes (Signed)
Pt belongings in 2 bags put in the locker. Pt in hospital clothes. Pt able to ambulate, change clothes, talk, and answer questions. Pt is loud , flirtatious and is pleasant , but does play around in an joking inappropriate manner.

## 2021-01-12 NOTE — ED Notes (Signed)
Safety tray delivered. Pt ate 100% of sandwich and half a bag of pretzels. Beginning to calm down with staff and be more cooperative

## 2021-01-12 NOTE — BH Assessment (Signed)
Per Maxie Barb, NP overnight observation in the ED is recommended due to safety concerns, and to allow time for patient to sober to engage appropriately in AM reassessment by psychiatry. Pauline Aus, PA informed of recommendation.

## 2021-01-12 NOTE — ED Notes (Signed)
Pt returned from CT. Remains asleep at this time.

## 2021-01-12 NOTE — ED Notes (Signed)
TTS at this time. 

## 2021-01-12 NOTE — ED Notes (Signed)
Pt sleeping at this time.
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
## 2021-01-12 NOTE — ED Notes (Signed)
Pt to CT. Sitter accompanied
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
## 2021-01-13 ENCOUNTER — Encounter (HOSPITAL_COMMUNITY): Payer: Self-pay | Admitting: Registered Nurse

## 2021-01-13 DIAGNOSIS — F10929 Alcohol use, unspecified with intoxication, unspecified: Secondary | ICD-10-CM

## 2021-01-13 DIAGNOSIS — R45850 Homicidal ideations: Secondary | ICD-10-CM | POA: Insufficient documentation

## 2021-01-13 DIAGNOSIS — F1094 Alcohol use, unspecified with alcohol-induced mood disorder: Secondary | ICD-10-CM

## 2021-01-13 DIAGNOSIS — R45851 Suicidal ideations: Secondary | ICD-10-CM

## 2021-01-13 MED ORDER — QUETIAPINE FUMARATE 25 MG PO TABS: 50.0000 mg | ORAL_TABLET | Freq: Every day | ORAL | Status: DC

## 2021-01-13 MED ORDER — LORAZEPAM 1 MG PO TABS
2.0000 mg | ORAL_TABLET | Freq: Once | ORAL | Status: AC
  Administered 2021-01-13: 2 mg via ORAL
  Filled 2021-01-13: qty 2

## 2021-01-13 MED ORDER — QUETIAPINE FUMARATE 25 MG PO TABS
25.0000 mg | ORAL_TABLET | ORAL | Status: AC
  Administered 2021-01-13: 25 mg via ORAL
  Filled 2021-01-13: qty 1

## 2021-01-13 MED ORDER — QUETIAPINE FUMARATE 50 MG PO TABS: 50.0000 mg | ORAL_TABLET | Freq: Every day | ORAL | 0 refills | Status: DC

## 2021-01-13 NOTE — Discharge Instructions (Addendum)
If you have any thoughts of hurting yourself or others please utilize the resources listed in this packet or return immediately to emergency department if you have suicidal or homicidal thoughts.  We are here to help.  You may call the suicide prevention Hotline at 1-800-273-8255. 

## 2021-01-13 NOTE — ED Notes (Signed)
EDP notified of pt BP  

## 2021-01-13 NOTE — Consult Note (Signed)
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 male patient admitted to [facility] ED when he presented intoxicated via EMS with complaints of suicidal and homicidal ideation.  HPI:  Ross Oconnell, 55 y.o., male patient seen via tele health by this provider, consulted with Dr. Nelly Rout; and chart reviewed on 01/13/21.  On evaluation Ross Oconnell reports he was brought to the hospital related to having negative thoughts.  Patient reports recently he has been having mood swings where he is easily agitated and whenever in a altercation or argument he wishes the person dead.  Patient reports that he has no intent or plan to harm or kill anybody it is just the way he feels when he is angry and he has been angry a lot more lately.  Patient denies that he wants to hurt or kill himself; but does state that he has a history of suicide attempt back in 1985 when he tried to overdose.  Patient reports that he lives alone, and that he is unemployed at this time but gets support from his family.  Patient states that he does drink alcohol about 2-3 times a week.  Patient has no outpatient psychiatric services but reports that he is interested.  Discussed starting Seroquel to assist with depression, anxiety, and sleep.  Patient agreed.  Also discussed setting up outpatient psychiatric services for medication management and therapy.  Patient is also interested. During evaluation Ross Oconnell is laying in bed in no acute distress.  He is alert, oriented x  4, calm and cooperative.  His mood is euthymic with congruent affect.  He does not appear to be responding to internal/external stimuli or delusional thoughts.  Patient denies suicidal/self-harm/homicidal ideation, psychosis, and paranoia.  Patient answered question appropriately.  Past Psychiatric History: Patient reports a history of depression, one prior psychiatric hospitalization in 1985 for suicide attempt.  Patient denies alcohol abuse but possible history of alcohol use disorder.  Risk to Self:   Risk to Others:   Prior Inpatient Therapy:   Prior Outpatient Therapy:    Past Medical History:  Past Medical History:  Diagnosis Date  . C4 cervical fracture (HCC) 03/20/2013   [name] 04/06/2013 (04/07/2013)  . Chronic knee pain   . Chronic lower back pain   . Fall down steps 04/06/2013  . Hip fracture (HCC) ?1969   "don't remember which side" (04/07/2013)  . Lumbar herniated disc   . Numbness and tingling of both legs    "sometimes" (04/07/2013)  . Pneumonia fall 2013    Past Surgical History:  Procedure Laterality Date  . HAND TENDON SURGERY Right ~ 2011   "had tendons reattached" (04/07/2013)   Family History:  Family History  Problem Relation Age of Onset  . Hypertension Mother   . Diabetes Mother    Family Psychiatric  History: None reported Social History:  Social History   Substance and Sexual Activity  Alcohol Use Yes  . Alcohol/week: 2.0 standard drinks  . Types: 2 Cans of beer per week   Comment: 04/07/2013 "one weekend day/week"     Social History   Substance  and Sexual Activity  Drug Use Not Currently    Social History   Socioeconomic History  . Marital status: Single    Spouse name: Not on file  . Number of children: Not on file  . Years of education: Not on file  . Highest education level: Not on file  Occupational History  . Not on file  Tobacco Use  . Smoking status: Current Every Day Smoker    Packs/day: 0.33    Years: 17.00    Pack years:  5.61    Types: Cigarettes  . Smokeless tobacco: Never Used  . Tobacco comment: 04/07/2013 offered smoking cessation materials; pt declines  Substance and Sexual Activity  . Alcohol use: Yes    Alcohol/week: 2.0 standard drinks    Types: 2 Cans of beer per week    Comment: 04/07/2013 "one weekend day/week"  . Drug use: Not Currently  . Sexual activity: Not Currently  Other Topics Concern  . Not on file  Social History Narrative   ** Merged History Encounter **       Social Determinants of Health   Financial Resource Strain: Not on file  Food Insecurity: Not on file  Transportation Needs: Not on file  Physical Activity: Not on file  Stress: Not on file  Social Connections: Not on file   Additional Social History:    Allergies:  No Known Allergies  Labs:  Results for orders placed or performed during the hospital encounter of 01/12/21 (from the past 48 hour(s))  Comprehensive metabolic panel     Status: Abnormal   Collection Time: 01/12/21  3:57 PM  Result Value Ref Range   Sodium 141 135 - 145 mmol/L   Potassium 3.7 3.5 - 5.1 mmol/L   Chloride 111 98 - 111 mmol/L   CO2 22 22 - 32 mmol/L   Glucose, Bld 70 70 - 99 mg/dL    Comment: Glucose reference range applies only to samples taken after fasting for at least 8 hours.   BUN 9 6 - 20 mg/dL   Creatinine, Ser 9.52 0.61 - 1.24 mg/dL   Calcium 8.7 (L) 8.9 - 10.3 mg/dL   Total Protein 7.5 6.5 - 8.1 g/dL   Albumin 4.2 3.5 - 5.0 g/dL   AST 34 15 - 41 U/L   ALT 22 0 - 44 U/L   Alkaline Phosphatase 98 38 - 126 U/L   Total Bilirubin 1.0 0.3 - 1.2 mg/dL   GFR, Estimated >84 >13 mL/min    Comment: (NOTE) Calculated using the CKD-EPI Creatinine Equation (2021)    Anion gap 8 5 - 15    Comment: Performed at [facility], [address], [place], [place] [address]  Ethanol     Status: Abnormal   Collection Time: 01/12/21  3:57 PM  Result Value Ref Range   Alcohol, Ethyl (B) 325 (HH) <10 mg/dL    Comment: CRITICAL RESULT CALLED  TO, READ BACK BY AND VERIFIED WITH: DOSS @ 1658 ON [id number] BY HENDERSON L. Performed at [facility], [address], [place], [place] [address]   Salicylate level     Status: Abnormal   Collection Time: 01/12/21  3:57 PM  Result Value Ref Range   Salicylate Lvl <7.0 (L) 7.0 - 30.0 mg/dL    Comment: Performed at [facility], [address] [place], [place] [address]  Acetaminophen level     Status: Abnormal   Collection Time: 01/12/21  3:57 PM  Result Value Ref Range   Acetaminophen (Tylenol),  Serum <10 (L) 10 - 30 ug/mL    Comment: Performed at [facility], [address] [place], [place] [address]  cbc     Status: None   Collection Time: 01/12/21  3:57 PM  Result Value Ref Range   WBC 9.4 4.0 - 10.5 K/uL   RBC 5.05 4.22 - 5.81 MIL/uL   Hemoglobin 14.7 13.0 - 17.0 g/dL   HCT 60.445.5 54.039.0 - 98.152.0 %   MCV 90.1 80.0 - 100.0 fL   MCH 29.1 26.0 - 34.0 pg   MCHC 32.3 30.0 - 36.0 g/dL   RDW 19.114.7 47.811.5 - 29.515.5 %   Platelets 235 150 - 400 K/uL   nRBC 0.0 0.0 - 0.2 %    Comment: Performed at [facility], [address] [place], [place] [address]    Medications:  Current Facility-Administered Medications  Medication Dose Route Frequency Provider Last Rate Last Admin  . gabapentin (NEURONTIN) capsule 200 mg  200 mg Oral BID Ophelia ShoulderMills, Shnese E, NP   200 mg at 01/13/21 1019  . LORazepam (ATIVAN) injection 0-4 mg  0-4 mg Intravenous Q6H Triplett, Tammy, PA-C       Or  . LORazepam (ATIVAN) tablet 0-4 mg  0-4 mg Oral Q6H Triplett, Tammy, PA-C      . [START ON 01/15/2021] LORazepam (ATIVAN) injection 0-4 mg  0-4 mg Intravenous Q12H Triplett, Tammy, PA-C       Or  . [START ON 01/15/2021] LORazepam (ATIVAN) tablet 0-4 mg  0-4 mg Oral Q12H Triplett, Tammy, PA-C      . QUEtiapine (SEROQUEL) tablet 25 mg  25 mg Oral NOW Shuaib Corsino B, NP      . QUEtiapine (SEROQUEL) tablet 50 mg  50 mg Oral QHS Saed Hudlow B, NP      . thiamine tablet 100 mg  100 mg Oral Daily Triplett, Tammy, PA-C   100 mg at  01/13/21 1019   Or  . thiamine (B-1) injection 100 mg  100 mg Intravenous Daily Triplett, Tammy, PA-C       Current Outpatient Medications  Medication Sig Dispense Refill  . HYDROcodone-acetaminophen (NORCO/VICODIN) 5-325 MG tablet Take one tab po q 4-6 hrs prn pain 15 tablet 0  . ibuprofen (ADVIL,MOTRIN) 800 MG tablet Take 1 tablet (800 mg total) by mouth 3 (three) times daily. Take with food 21 tablet 0    Musculoskeletal: Strength & Muscle Tone: within normal limits Gait & Station: normal Patient leans: N/A  Psychiatric Specialty Exam: Physical Exam Vitals and nursing note reviewed. Chaperone present: Comptrolleritter.  Constitutional:      General: He is not in acute distress.    Appearance: Normal appearance. He is not ill-appearing.  Cardiovascular:     Rate and Rhythm: Normal rate.  Pulmonary:     Effort: Pulmonary effort is normal.  Musculoskeletal:        General: Normal range of motion.     Cervical back: Normal range of motion.  Neurological:     Mental Status: He is alert and oriented to person, place, and time.  Psychiatric:        Attention and Perception: Attention and perception normal. He does not perceive auditory or visual hallucinations.        Mood and Affect: Mood and affect normal.        Speech: Speech normal.        Behavior: Behavior normal. Behavior is cooperative.        Thought Content: Thought content normal. Thought content  is not paranoid or delusional. Thought content does not include homicidal or suicidal ideation.        Cognition and Memory: Cognition and memory normal.        Judgment: Judgment normal.     Review of Systems  Constitutional: Negative.   HENT: Negative.   Eyes: Negative.   Respiratory: Negative.   Cardiovascular: Negative.   Gastrointestinal: Negative.   Genitourinary: Negative.   Musculoskeletal: Positive for back pain.  Skin: Negative.   Neurological: Negative.   Hematological: Negative.   Psychiatric/Behavioral: Negative  for agitation, behavioral problems, confusion, hallucinations, self-injury (Denies) and suicidal ideas (Denies). The patient is not nervous/anxious.        Patient states that he does have mood swings and is easily agitated.  States when home and get into an argument he wishes the person was dead "That's why I try to avoid getting into arguments or altercations with other; but I get mad so easy."    Blood pressure (!) 131/92, pulse 86, temperature 98.1 F (36.7 C), resp. rate 18, height 5\' 11"  (1.803 m), weight 72.6 kg, SpO2 96 %.Body mass index is 22.32 kg/m.  General Appearance: Casual  Eye Contact:  Good  Speech:  Clear and Coherent and Normal Rate  Volume:  Normal  Mood:  Euthymic  Affect:  Appropriate and Congruent  Thought Process:  Coherent, Goal Directed and Descriptions of Associations: Intact  Orientation:  Full (Time, Place, and Person)  Thought Content:  WDL  Suicidal Thoughts:  No  Homicidal Thoughts:  No  Memory:  Immediate;   Good Recent;   Good  Judgement:  Intact  Insight:  Present  Psychomotor Activity:  Normal  Concentration:  Concentration: Good and Attention Span: Good  Recall:  Good  Fund of Knowledge:  Good  Language:  Good  Akathisia:  No  Handed:  Right  AIMS (if indicated):     Assets:  Communication Skills Desire for Improvement Housing Social Support  ADL's:  Intact  Cognition:  WNL  Sleep:      Treatment Plan Summary: Plan Psychiatrically clear.  Social work consult to set up with outpatient psychiatric services.  Disposition:  Psychiatrically cleared No evidence of imminent risk to self or others at present.   Patient does not meet criteria for psychiatric inpatient admission. Supportive therapy provided about ongoing stressors. Refer to IOP. Discussed crisis plan, support from social network, calling 911, coming to the Emergency Department, and calling Suicide Hotline.  This service was provided via telemedicine using a 2-way, interactive  audio and video technology.  Names of all persons participating in this telemedicine service and their role in this encounter. Name: Role: NP  Name: Dr. Assunta Found Role: Psychiatrist  Name: Nelly Rout Role: Patient  Name: Dr. Coralyn Helling Role: [facility] EDP sent a secure message informing:  Patient seen and psychiatrically cleared.  Ordered one dose of Seroquel 25 mg x one dose to make sure patient did not have an allergic reaction to it.  He will need a prescription for Seroquel 50 mg Q hs at least 30 days' worth.  Will order social work consult to set patient up with outpatient psychiatric services for medication management and therapy.      Jessyca Sloan, NP 01/13/2021 10:55 AM

## 2021-01-13 NOTE — ED Notes (Signed)
TTS in progress at this time.  

## 2021-01-13 NOTE — ED Notes (Signed)
Crystal from Social work called and given update.  

## 2021-01-13 NOTE — ED Notes (Signed)
EDP notified of BP and pt on CIWA scale but did not meet parameters earlier. Orders received.

## 2021-01-13 NOTE — ED Notes (Signed)
Social work at bedside.  

## 2021-01-13 NOTE — ED Provider Notes (Addendum)
Emergency Medicine Observation Re-evaluation Note  Ross Oconnell is a 55 y.o. male, seen on rounds today.  Pt initially presented to the ED for complaints of V70.1 Currently, the patient is resting comfortably.  Physical Exam  BP (!) 131/92 (BP Location: Right Arm)   Pulse 86   Temp 98.1 F (36.7 C)   Resp 18   Ht 5\' 11"  (1.803 m)   Wt 72.6 kg   SpO2 96%   BMI 22.32 kg/m  Physical Exam General: in bed, answers to name Lungs: no respiratory distress, unlabored breathing Psych: currently calm and resting  ED Course / MDM  EKG:    I have reviewed the labs performed to date as well as medications administered while in observation.  Recent changes in the last 24 hours include none.  Plan  Current plan is for reevaluation by TTS today.  TTS has reevaluated the patient and has deemed him safe for discharge and outpatient follow-up.  They have requested that Seroquel be prescribed, this prescription has been sent.  IVC has been rescinded.  Patient appears appropriate for discharge, vitals are stable.  Upon discharge patient was noted to be hypertensive and tachycardic.  Patient was on CIWA protocol that has not been utilized recently, he continues to display symptoms of alcohol withdrawal.  Will treat with Ativan, if this normalizes vitals patient may be discharged.  Is been given outpatient information in terms of alcohol abuse and substance abuse.  Denies any interested in detox.    , DO 01/13/21 01/15/21    2505, DO 01/13/21 1111    Aadya Kindler, 01/15/21, DO 01/13/21 [id number]

## 2021-01-13 NOTE — TOC Initial Note (Signed)
Transition of Care [facility]) - Initial/Assessment Note    Patient Details  Name: Ross Oconnell MRN: [id number] Date of Birth: 1966/08/11  Transition of Care [facility]) CM/SW Contact:    Elliot Gurney [place], [place] Phone Number:[phone] 01/13/2021, 1:24 PM  Clinical Narrative:                 Met with patient at bedside. This social worker consulted to provide patient with mental health resources for follow  Up once discharged. Patient discussed a history of depression and is open to follow up with resources provided. Patient provided with resources for inpatient and outpatiemt substance abuse resources as well as general community and mental health resources. Patient states that he has transportation home-friend will come when discharged.   Transition of Care to continue to follow with discharge needs as needed.   Timberlee Roblero, LCSW Transition of care [phone]    Expected Discharge Plan: Home/Self Care Barriers to Discharge: No Barriers Identified   Patient Goals and CMS Choice Patient states their goals for this hospitalization and ongoing recovery are:: to return home      Expected Discharge Plan and Services Expected Discharge Plan: Home/Self Care In-house Referral: Clinical Social Work                                            Prior Living Arrangements/Services     Patient language and need for interpreter reviewed:: Yes        Need for Family Participation in Patient Care: No (Comment) Care giver support system in place?: No (comment)   Criminal Activity/Legal Involvement Pertinent to Current Situation/Hospitalization: No - Comment as needed  Activities of Daily Living      Permission Sought/Granted                  Emotional Assessment Appearance:: Appears older than stated age Attitude/Demeanor/Rapport:  (cooperative) Affect (typically observed): Accepting Orientation: : Oriented to Self,Oriented to Place,Oriented to  Time,Oriented  to Situation Alcohol / Substance Use: Alcohol Use Psych Involvement: No (comment)  Admission diagnosis:  . Patient Active Problem List   Diagnosis Date Noted  . 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 Homicidal ideations   . Suicidal ideations   . Sacroiliac (ligament) sprain 12/24/2012  . Lumbar degenerative disc disease 12/24/2012   PCP:  Patient, No Pcp Per Pharmacy:   [place], [place] - [address] [place] [address] Phone: [phone] Fax: [phone]     Social Determinants of Health (SDOH) Interventions    Readmission Risk Interventions No flowsheet data found.
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
## 2022-12-16 ENCOUNTER — Emergency Department
Admission: EM | Admit: 2022-12-16 | Discharge: 2022-12-17 | Disposition: A | Discharge status: Home / Self Care | Payer: No Typology Code available for payment source | Attending: Emergency Medicine | Admitting: Emergency Medicine

## 2022-12-16 ENCOUNTER — Other Ambulatory Visit: Payer: Self-pay

## 2022-12-16 DIAGNOSIS — F10929 Alcohol use, unspecified with intoxication, unspecified: Secondary | ICD-10-CM | POA: Diagnosis present

## 2022-12-16 DIAGNOSIS — R45850 Homicidal ideations: Secondary | ICD-10-CM | POA: Diagnosis not present

## 2022-12-16 DIAGNOSIS — F1014 Alcohol abuse with alcohol-induced mood disorder: Secondary | ICD-10-CM | POA: Insufficient documentation

## 2022-12-16 DIAGNOSIS — F309 Manic episode, unspecified: Secondary | ICD-10-CM | POA: Diagnosis not present

## 2022-12-16 DIAGNOSIS — R45851 Suicidal ideations: Secondary | ICD-10-CM | POA: Diagnosis not present

## 2022-12-16 DIAGNOSIS — F10959 Alcohol use, unspecified with alcohol-induced psychotic disorder, unspecified: Secondary | ICD-10-CM

## 2022-12-16 DIAGNOSIS — F17210 Nicotine dependence, cigarettes, uncomplicated: Secondary | ICD-10-CM | POA: Diagnosis not present

## 2022-12-16 DIAGNOSIS — F1094 Alcohol use, unspecified with alcohol-induced mood disorder: Secondary | ICD-10-CM | POA: Diagnosis not present

## 2022-12-16 LAB — COMPREHENSIVE METABOLIC PANEL
ALT: 13 U/L (ref 0–44)
AST: 20 U/L (ref 15–41)
Albumin: 4.1 g/dL (ref 3.5–5.0)
Alkaline Phosphatase: 89 U/L (ref 38–126)
Anion gap: 9 (ref 5–15)
BUN: 9 mg/dL (ref 6–20)
CO2: 25 mmol/L (ref 22–32)
Calcium: 9 mg/dL (ref 8.9–10.3)
Chloride: 108 mmol/L (ref 98–111)
Creatinine, Ser: 0.81 mg/dL (ref 0.61–1.24)
GFR, Estimated: >60 mL/min (ref >60)
Glucose, Bld: 82 mg/dL (ref 70–99)
Potassium: 3.6 mmol/L (ref 3.5–5.1)
Sodium: 142 mmol/L (ref 135–145)
Total Bilirubin: 0.9 mg/dL (ref 0.3–1.2)
Total Protein: 7.8 g/dL (ref 6.5–8.1)

## 2022-12-16 LAB — CBC WITH DIFFERENTIAL/PLATELET
Abs Immature Granulocytes: 0.01 10*3/uL (ref 0.00–0.07)
Basophils Absolute: 0.1 10*3/uL (ref 0.0–0.1)
Basophils Relative: 1 %
Eosinophils Absolute: 0.2 10*3/uL (ref 0.0–0.5)
Eosinophils Relative: 3 %
HCT: 47.1 % (ref 39.0–52.0)
Hemoglobin: 15.1 g/dL (ref 13.0–17.0)
Immature Granulocytes: 0 %
Lymphocytes Relative: 35 %
Lymphs Abs: 2.6 10*3/uL (ref 0.7–4.0)
MCH: 29.2 pg (ref 26.0–34.0)
MCHC: 32.1 g/dL (ref 30.0–36.0)
MCV: 91.1 fL (ref 80.0–100.0)
Monocytes Absolute: 0.7 10*3/uL (ref 0.1–1.0)
Monocytes Relative: 10 %
Neutro Abs: 3.8 10*3/uL (ref 1.7–7.7)
Neutrophils Relative %: 51 %
Platelets: 244 10*3/uL (ref 150–400)
RBC: 5.17 MIL/uL (ref 4.22–5.81)
RDW: 14.4 % (ref 11.5–15.5)
WBC: 7.3 10*3/uL (ref 4.0–10.5)
nRBC: 0 % (ref 0.0–0.2)

## 2022-12-16 LAB — URINE DRUG SCREEN, QUALITATIVE (ARMC ONLY)
Amphetamines, Ur Screen: NOT DETECTED
Barbiturates, Ur Screen: NOT DETECTED
Benzodiazepine, Ur Scrn: NOT DETECTED
Cannabinoid 50 Ng, Ur ~~LOC~~: NOT DETECTED
Cocaine Metabolite,Ur ~~LOC~~: NOT DETECTED
MDMA (Ecstasy)Ur Screen: NOT DETECTED
Methadone Scn, Ur: NOT DETECTED
Opiate, Ur Screen: NOT DETECTED
Phencyclidine (PCP) Ur S: NOT DETECTED
Tricyclic, Ur Screen: NOT DETECTED

## 2022-12-16 LAB — ETHANOL: Alcohol, Ethyl (B): 217 mg/dL — ABNORMAL HIGH (ref <10)

## 2022-12-16 MED ORDER — ZIPRASIDONE MESYLATE 20 MG IM SOLR
20.0000 mg | Freq: Once | INTRAMUSCULAR | Status: AC
  Administered 2022-12-16: 20 mg via INTRAMUSCULAR
  Filled 2022-12-16: qty 20

## 2022-12-16 MED ORDER — QUETIAPINE FUMARATE 25 MG PO TABS: 50.0000 mg | ORAL_TABLET | Freq: Every day | ORAL | Status: DC

## 2022-12-16 NOTE — ED Notes (Signed)
Pt is asleep, did receive medication prior to this nurse arrival. Will allow pt to sleep at this time

## 2022-12-16 NOTE — ED Notes (Signed)
MD ordered IM injection due to patient boisterous  behavior. Pt took IM shot voluntarily. Pt allowing staff to obtain blood work and cooperative with dress out process. Pt belongings include 1 pant, 1 shorts, 1 underwear, 2 socks, 2 shoes, 1 shirt, 1 jacket

## 2022-12-16 NOTE — ED Notes (Addendum)
Pt awake, cooperative, calm. Provided with snack and drink. Denies SI, HI, AVH since he arrived

## 2022-12-16 NOTE — ED Notes (Signed)
Pt  placed  under  IVC PAPERS  INFORMED  ANNIE  RN

## 2022-12-16 NOTE — ED Notes (Signed)
IVC PENDING  CONSULT ?

## 2022-12-16 NOTE — ED Triage Notes (Signed)
Pt presents to ED via [facility] with c/o of SI and HI. Pt endorses he does want to kill himself and pother people if "he needs to".  Pt states he has been feeling like this for 6-7 months.   Pt is A&Ox4.

## 2022-12-16 NOTE — BH Assessment (Signed)
Comprehensive Clinical Assessment (CCA) Note  12/16/2022 Ross Oconnell [id number]  Chief Complaint: Patient is a 57 year old male presenting to [facility] ED under IVC. Per triage note Pt presents to ED via [facility] with c/o of SI and HI. Pt endorses he does want to kill himself and pother people if "he needs to". Pt states he has been feeling like this for 6-7 months. During assessment patient appears alert and oriented x3, calm and cooperative, speech is somewhat slurred. Patient reports "I be having dreams, I had a altercation with my family." "I drink occasionally, I need help with my dreams when I lay down it be so weird." When asked if patient is experiencing SI he reports "I thought about it a while ago on my way here, and I thought about hurting someone else." Patient's BAL is 217. Patient denies [place].  Per Psyc NP Ysidro Evert patient to be reassessed Chief Complaint  Patient presents with   Homicidal   Suicidal   Visit Diagnosis: Alcohol abuse, Substance-induced mood disorder    CCA Screening, Triage and Referral (STR)  Patient Reported Information How did you hear about [place]? Legal System  Referral name: No data recorded Referral phone number: No data recorded  Whom do you see for routine medical problems? No data recorded Practice/Facility Name: No data recorded Practice/Facility Phone Number: No data recorded Name of Contact: No data recorded Contact Number: No data recorded Contact Fax Number: No data recorded Prescriber Name: No data recorded Prescriber Address (if known): No data recorded  What Is the Reason for Your Visit/Call Today? Pt presents to ED via [facility] with c/o of SI and HI. Pt endorses he does want to kill himself and pother people if "he needs to".  Pt states he has been feeling like this for 6-7 months  How Long Has This Been Causing You Problems? > than 6 months  What Do You Feel Would Help You the Most Today? Treatment for Depression or  other mood problem   Have You Recently Been in Any Inpatient Treatment (Hospital/Detox/Crisis Center/28-Day Program)? No data recorded Name/Location of Program/Hospital:No data recorded How Long Were You There? No data recorded When Were You Discharged? No data recorded  Have You Ever Received Services From [facility] Before? No data recorded Who Do You See at [facility]? No data recorded  Have You Recently Had Any Thoughts About Hurting Yourself? Yes  Are You Planning to Commit Suicide/Harm Yourself At This time? No   Have you Recently Had Thoughts About [place]? No  Explanation: No data recorded  Have You Used Any Alcohol or Drugs in the Past 24 Hours? Yes  How Long Ago Did You Use Drugs or Alcohol? No data recorded What Did You Use and How Much? Alcohol "a 5th of tequila"   Do You Currently Have a Therapist/Psychiatrist? No  Name of Therapist/Psychiatrist: No data recorded  Have You Been Recently Discharged From Any Office Practice or Programs? No  Explanation of Discharge From Practice/Program: No data recorded    CCA Screening Triage Referral Assessment Type of Contact: Face-to-Face  Is this Initial or Reassessment? No data recorded Date Telepsych consult ordered in [facility]:  No data recorded Time Telepsych consult ordered in [facility]:  No data recorded  Patient Reported Information Reviewed? No data recorded Patient Left Without Being Seen? No data recorded Reason for Not Completing Assessment: No data recorded  Collateral Involvement: No data recorded  Does Patient Have a [place]?  No data recorded Name and Contact of Legal Guardian: No data recorded If Minor and Not Living with Parent(s), Who has Custody? No data recorded Is CPS involved or ever been involved? Never  Is APS involved or ever been involved? Never   Patient Determined To Be At Risk for Harm To Self or Others Based on Review of Patient Reported Information or  Presenting Complaint? No  Method: No data recorded Availability of Means: No data recorded Intent: No data recorded Notification Required: No data recorded Additional Information for Danger to Others Potential: No data recorded Additional Comments for Danger to Others Potential: No data recorded Are There Guns or Other Weapons in Your Home? No  Types of Guns/Weapons: No data recorded Are These Weapons Safely Secured?                            No data recorded Who Could Verify You Are Able To Have These Secured: No data recorded Do You Have any Outstanding Charges, Pending Court Dates, Parole/Probation? No data recorded Contacted To Inform of Risk of Harm To Self or Others: No data recorded  Location of Assessment: [facility] ED   Does Patient Present under Involuntary Commitment? Yes  IVC Papers Initial File Date: No data recorded  [place] of Residence: [place]   Patient Currently Receiving the Following Services: No data recorded  Determination of Need: Emergent (2 hours)   Options For Referral: No data recorded    CCA Biopsychosocial Intake/Chief Complaint:  No data recorded Current Symptoms/Problems: No data recorded  Patient Reported Schizophrenia/Schizoaffective Diagnosis in Past: No   Strengths: Patient is able to communicate  Preferences: No data recorded Abilities: No data recorded  Type of Services Patient Feels are Needed: No data recorded  Initial Clinical Notes/Concerns: No data recorded  Mental Health Symptoms Depression:   Change in energy/activity; Difficulty Concentrating; Fatigue; Hopelessness; Worthlessness   Duration of Depressive symptoms:  Greater than two weeks   Mania:   None   Anxiety:    Difficulty concentrating; Fatigue; Restlessness   Psychosis:   None   Duration of Psychotic symptoms: No data recorded  Trauma:   None   Obsessions:   None   Compulsions:   None   Inattention:   None   Hyperactivity/Impulsivity:    None   Oppositional/Defiant Behaviors:   None   Emotional Irregularity:   None   Other Mood/Personality Symptoms:  No data recorded   Mental Status Exam Appearance and self-care  Stature:   Average   Weight:   Average weight   Clothing:   Casual   Grooming:   Normal   Cosmetic use:   None   Posture/gait:   Normal   Motor activity:   Not Remarkable   Sensorium  Attention:   Normal   Concentration:   Normal   Orientation:   X5   Recall/memory:   Normal   Affect and Mood  Affect:   Appropriate   Mood:   Depressed   Relating  Eye contact:   Normal   Facial expression:   Responsive   Attitude toward examiner:   Cooperative   Thought and Language  Speech flow:  Clear and Coherent   Thought content:   Appropriate to Mood and Circumstances   Preoccupation:   None   Hallucinations:   None   Organization:  No data recorded  [employer] of Knowledge:   Fair   Intelligence:  Average   Abstraction:   Normal   Judgement:   Impaired   Reality Testing:   Adequate   Insight:   Fair   Decision Making:   Normal   Social Functioning  Social Maturity:   Responsible   Social Judgement:   Normal   Stress  Stressors:   Family conflict   Coping Ability:   Contractor Deficits:   None   Supports:   Family     Religion: Religion/Spirituality Are You A Religious Person?: No  Leisure/Recreation: Leisure / Recreation Do You Have Hobbies?: No  Exercise/Diet: Exercise/Diet Do You Exercise?: No Have You Gained or Lost A Significant Amount of Weight in the Past Six Months?: No Do You Follow a Special Diet?: No Do You Have Any Trouble Sleeping?: Yes Explanation of Sleeping Difficulties: Patient reports some difficulty sleeping due to "bad dreams"   CCA Employment/Education Employment/Work Situation: Employment / Work Situation Employment Situation: Unemployed Patient's Job has Been  Impacted by Current Illness: No Has Patient ever Been in Equities trader?: No  Education: Education Is Patient Currently Attending School?: No Did You Have An Individualized Education Program (IIEP): No Did You Have Any Difficulty At [employer]?: No Patient's Education Has Been Impacted by Current Illness: No   CCA Family/Childhood History Family and Relationship History: Family history Marital status: Single Does patient have children?:  (Unknown)  Childhood History:  Childhood History Did patient suffer any verbal/emotional/physical/sexual abuse as a child?: No Did patient suffer from severe childhood neglect?: No Has patient ever been sexually abused/assaulted/raped as an adolescent or adult?: No Was the patient ever a victim of a crime or a disaster?: No Witnessed domestic violence?: No Has patient been affected by domestic violence as an adult?: No  Child/Adolescent Assessment:     CCA Substance Use Alcohol/Drug Use: Alcohol / Drug Use Pain Medications: See MAR Prescriptions: See MAR Over the Counter: See MAR History of alcohol / drug use?: Yes Substance #1 Name of Substance 1: Alcohol 1 - Age of First Use: Unknown 1 - Amount (size/oz): "a 5th of tequila" 1 - Frequency: daily 1 - Duration: Unknown 1 - Last Use / Amount: 12/16/22 1- Route of Use: Oral                       ASAM's:  Six Dimensions of Multidimensional Assessment  Dimension 1:  Acute Intoxication and/or Withdrawal Potential:      Dimension 2:  Biomedical Conditions and Complications:      Dimension 3:  Emotional, Behavioral, or Cognitive Conditions and Complications:     Dimension 4:  Readiness to Change:     Dimension 5:  Relapse, Continued use, or Continued Problem Potential:     Dimension 6:  Recovery/Living Environment:     ASAM Severity Score:    ASAM Recommended Level of Treatment: ASAM Recommended Level of Treatment: Level III Residential Treatment   Substance use Disorder  (SUD) Substance Use Disorder (SUD)  Checklist Symptoms of Substance Use: Continued use despite having a persistent/recurrent physical/psychological problem caused/exacerbated by use, Continued use despite persistent or recurrent social, interpersonal problems, caused or exacerbated by use, Evidence of tolerance, Persistent desire or unsuccessful efforts to cut down or control use, Presence of craving or strong urge to use, Recurrent use that results in a failure to fulfill major role obligations (work, school, home), Social, occupational, recreational activities given up or reduced due to use, Substance(s) often taken in larger amounts or over longer times  than was intended, Large amounts of time spent to obtain, use or recover from the substance(s)  Recommendations for Services/Supports/Treatments: Recommendations for Services/Supports/Treatments Recommendations For Services/Supports/Treatments: Individual Therapy, CD-IOP Intensive Chemical Dependency Program, IOP (Intensive Outpatient Program)  DSM5 Diagnoses: Patient Active Problem List   Diagnosis Date Noted   Onset of alcohol-induced mood disorder during intoxication ([place]) 01/13/2021   Homicidal ideations    Suicidal ideations    Sacroiliac (ligament) sprain 12/24/2012   Lumbar degenerative disc disease 12/24/2012    Patient Centered Plan: Patient is on the following Treatment Plan(s):  Depression and Substance Abuse   Referrals to Alternative Service(s): Referred to Alternative Service(s):   Place:   Date:   Time:    Referred to Alternative Service(s):   Place:   Date:   Time:    Referred to Alternative Service(s):   Place:   Date:   Time:    Referred to Alternative Service(s):   Place:   Date:   Time:      @BHCOLLABOFCARE @  [employer], LCAS-A

## 2022-12-16 NOTE — ED Provider Notes (Signed)
   [facility] Provider Note    None    (approximate)   History   Homicidal and Suicidal   HPI  Ross Oconnell is a 57 y.o. male  here with behavioral issues. Pt reports that he has been off his medications for months and things have been "really hard." He states he feels like he could snap at any moment and kill anyone, and wants to "watch them bleed." He has flights of ideas and is distracted throughout interview, intermittently yelling at staff or passerbys. He became agitated when questioned about wanting to harm himself.    Physical Exam   Triage Vital Signs: ED Triage Vitals  Enc Vitals Group     BP      Pulse      Resp      Temp      Temp src      SpO2      Weight      Height      Head Circumference      Peak Flow      Pain Score      Pain Loc      Pain Edu?      Excl. in [place]?     Most recent vital signs: Vitals:   12/16/22 1736  BP: 126/84  Pulse: 100  Resp: 18  Temp: 98 F (36.7 C)  SpO2: 100%     General: Awake, no distress.  CV:  Good peripheral perfusion.  Resp:  Normal effort.  Abd:  No distention.  Other:  Erratic, pressured speech with labile affect.   ED Results / Procedures / Treatments   Labs (all labs ordered are listed, but only abnormal results are displayed) Labs Reviewed  ETHANOL - Abnormal; Notable for the following components:      Result Value   Alcohol, Ethyl (B) 217 (*)    All other components within normal limits  COMPREHENSIVE METABOLIC PANEL  URINE DRUG SCREEN, QUALITATIVE ([facility] ONLY)  CBC WITH DIFFERENTIAL/PLATELET     EKG    RADIOLOGY    I also independently reviewed and agree with radiologist interpretations.   PROCEDURES:  Critical Care performed: No   MEDICATIONS ORDERED IN ED: Medications  QUEtiapine (SEROQUEL) tablet 50 mg (has no administration in time range)  ziprasidone (GEODON) injection 20 mg (20 mg Intramuscular Given 12/16/22 1745)     IMPRESSION / MDM /  ASSESSMENT AND PLAN / ED COURSE  I reviewed the triage vital signs and the nursing notes.                              Differential diagnosis includes, but is not limited to, behavioral issue, substance abuse, depression, mania w/ bipolar disorder  Patient's presentation is most consistent with acute presentation with potential threat to life or bodily function.  57 yo M here with agitation, mania. Pt labile and occasionally threatening on arrival. IVC placed and pt given geodon. Labs overall unremarkable. EtOH 217. CBC, CMP unremarkable. Will consult TTS/Psych. Pt medically stable for psych disposition.  FINAL CLINICAL IMPRESSION(S) / ED DIAGNOSES   Final diagnoses:  Mania ([place])     Rx / DC Orders   ED Discharge Orders     None        Note:  This document was prepared using Dragon voice recognition software and may include unintentional dictation errors.   Duffy Bruce, MD 12/16/22 2300

## 2022-12-17 DIAGNOSIS — F1094 Alcohol use, unspecified with alcohol-induced mood disorder: Secondary | ICD-10-CM

## 2022-12-17 DIAGNOSIS — F10929 Alcohol use, unspecified with intoxication, unspecified: Secondary | ICD-10-CM | POA: Diagnosis not present

## 2022-12-17 DIAGNOSIS — F309 Manic episode, unspecified: Secondary | ICD-10-CM | POA: Diagnosis not present

## 2022-12-17 MED ORDER — ACETAMINOPHEN 325 MG PO TABS
650.0000 mg | ORAL_TABLET | Freq: Four times a day (QID) | ORAL | Status: DC | PRN
  Administered 2022-12-17: 650 mg via ORAL
  Filled 2022-12-17: qty 2

## 2022-12-17 MED ORDER — IBUPROFEN 600 MG PO TABS
600.0000 mg | ORAL_TABLET | Freq: Four times a day (QID) | ORAL | Status: DC | PRN
  Administered 2022-12-17: 600 mg via ORAL
  Filled 2022-12-17: qty 1

## 2022-12-17 MED ORDER — QUETIAPINE FUMARATE 50 MG PO TABS: 50.0000 mg | ORAL_TABLET | Freq: Every day | ORAL | 0 refills | Status: AC

## 2022-12-17 NOTE — ED Provider Notes (Signed)
Emergency Medicine Observation Re-evaluation Note  Ross Oconnell is a 57 y.o. male, seen on rounds today.  Pt initially presented to the ED for complaints of Homicidal and Suicidal  Currently, the patient is is no acute distress. Denies any concerns at this time.  Physical Exam  Blood pressure 126/84, pulse 100, temperature 98 F (36.7 C), temperature source Oral, resp. rate 18, SpO2 100 %.  Physical Exam: General: No apparent distress Pulm: Normal WOB Neuro: Moving all extremities Psych: Resting comfortably     ED Course / MDM     I have reviewed the labs performed to date as well as medications administered while in observation.  Recent changes in the last 24 hours include: No acute events overnight.  Plan   Current plan: Patient awaiting psychiatric disposition. Patient is under full IVC at this time.    Gwen Edler, Delice Bison, DO 12/17/22 [phone]

## 2022-12-17 NOTE — ED Notes (Signed)
Pt received breakfast tray and drink at this time. 

## 2022-12-17 NOTE — ED Notes (Signed)
IVC/Consult completed/Observe overnight re-assess in Am

## 2022-12-17 NOTE — ED Notes (Signed)
IVC  papers  rescinded  per  Waldon Merl  NP  informed  Toney Sang

## 2022-12-17 NOTE — ED Notes (Signed)
All belongings returned. Verified correct patient and correct discharge papers given. Pt alert and oriented X 4, stable for discharge. RR even and unlabored, color WNL. Discussed discharge instructions and follow-up as directed. Discharge medications discussed, when prescribed. Pt had opportunity to ask questions, and RN available to provide patient and/or family education.

## 2022-12-17 NOTE — ED Notes (Signed)
Pt complains of migraine, requests something for relief. Pt denies daily drinking, reports today was a binge. Secure chat was sent to Dr. Leonides Schanz

## 2022-12-17 NOTE — ED Notes (Signed)
Psych team at bedside to reassess.

## 2022-12-17 NOTE — Consult Note (Signed)
[facility] Face-to-Face Psychiatry Consult   Reason for Consult: Homicidal and Suicidal  Referring Physician: Dr. 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 male patient presented to  [facility] ED via law enforcement under involuntary commitment status (IVC). The patient shared that he is unable to sleep. "I keep having bad dreams. I am harming someone in my dreams, and I am enjoying seeing them suffer." The patient shared that he does not know why his dreams are like that. The patient shared that he is not on any medications, and his BAL is 217 mg/dl. The patient shared he has been feeling like this for 6-7 months. The patient shared that he had SI, "I thought about it a while ago on my way here, and I thought about hurting someone else."   This provider saw The patient face-to-face; the chart was reviewed, and Dr. Erma Heritage was consulted on 12/16/2022 due to the patient's care. It was discussed with the EDP that the patient would remain under observation overnight and be reassessed in the morning due to his alcohol level and his being difficult to arouse and when arousal his speech is slurring. The patient was medicated due to bizarre behaviors when he arrived at the ED.  Upon evaluation, the patient is alert and oriented x3, calm, cooperative, and mood-congruent with affect. The patient does not appear to be responding to internal or external stimuli. Neither is the patient presenting with any delusional thinking. The patient denies auditory or visual hallucinations. The patient voiced that he was suicidal before getting to the ED. He admits to suicidal homicidal ideations in his dreams,  which is concerning to him. The patient is not presenting with any psychotic or paranoid behaviors. During an encounter with the patient, he could answer questions appropriately.  HPI: Per Dr.   Past Psychiatric History: History reviewed. No pertinent past psychiatric history  Risk to Self:   Risk to Others:   Prior Inpatient Therapy:   Prior Outpatient Therapy:    Past Medical History:  Past Medical History:  Diagnosis Date   C4 cervical fracture (HCC) 03/20/2013   [name] 04/06/2013 (04/07/2013)   Chronic knee pain    Chronic lower back pain    Fall down steps 04/06/2013   Hip fracture (HCC) ?1969   "don't remember which side" (04/07/2013)   Lumbar herniated disc    Numbness and tingling of both legs    "sometimes" (04/07/2013)   Pneumonia fall 2013    Past Surgical History:  Procedure Laterality Date   HAND TENDON SURGERY Right ~ 2011   "had tendons reattached" (04/07/2013)   Family History:  Family History  Problem Relation Age of Onset   Hypertension Mother    Diabetes Mother    Family Psychiatric  History: History reviewed. No pertinent family psychiatric history Social History:  Social History   Substance and Sexual Activity  Alcohol Use Yes   Alcohol/week: 2.0 standard drinks of alcohol   Types: 2 Cans of beer per week   Comment: 04/07/2013 "one weekend day/week"     Social History   Substance and Sexual Activity  Drug Use Not Currently    Social History   Socioeconomic History   Marital status: Single  Spouse name: Not on file   Number of children: Not on file   Years of education: Not on file   Highest education level: Not on file  Occupational History   Not on file  Tobacco Use   Smoking status: Every Day    Packs/day: 0.33    Years: 17.00    Total pack years: 5.61    Types: Cigarettes   Smokeless tobacco: Never   Tobacco comments:    04/07/2013 offered smoking cessation materials; pt declines  Substance and Sexual Activity   Alcohol use: Yes     Alcohol/week: 2.0 standard drinks of alcohol    Types: 2 Cans of beer per week    Comment: 04/07/2013 "one weekend day/week"   Drug use: Not Currently   Sexual activity: Not Currently  Other Topics Concern   Not on file  Social History Narrative   ** Merged History Encounter **       Social Determinants of Health   Financial Resource Strain: Not on file  Food Insecurity: Not on file  Transportation Needs: Not on file  Physical Activity: Not on file  Stress: Not on file  Social Connections: Not on file   Additional Social History:    Allergies:  No Known Allergies  Labs:  Results for orders placed or performed during the hospital encounter of 12/16/22 (from the past 48 hour(s))  Comprehensive metabolic panel     Status: None   Collection Time: 12/16/22  5:47 PM  Result Value Ref Range   Sodium 142 135 - 145 mmol/L   Potassium 3.6 3.5 - 5.1 mmol/L   Chloride 108 98 - 111 mmol/L   CO2 25 22 - 32 mmol/L   Glucose, Bld 82 70 - 99 mg/dL    Comment: Glucose reference range applies only to samples taken after fasting for at least 8 hours.   BUN 9 6 - 20 mg/dL   Creatinine, Ser 0.81 0.61 - 1.24 mg/dL   Calcium 9.0 8.9 - 10.3 mg/dL   Total Protein 7.8 6.5 - 8.1 g/dL   Albumin 4.1 3.5 - 5.0 g/dL   AST 20 15 - 41 U/L   ALT 13 0 - 44 U/L   Alkaline Phosphatase 89 38 - 126 U/L   Total Bilirubin 0.9 0.3 - 1.2 mg/dL   GFR, Estimated >60 >60 mL/min    Comment: (NOTE) Calculated using the CKD-EPI Creatinine Equation (2021)    Anion gap 9 5 - 15    Comment: Performed at [facility], [address], [place], [place] [address]  Ethanol     Status: Abnormal   Collection Time: 12/16/22  5:47 PM  Result Value Ref Range   Alcohol, Ethyl (B) 217 (H) <10 mg/dL    Comment: (NOTE) Lowest detectable limit for serum alcohol is 10 mg/dL.  For medical purposes only. Performed at [facility], [address] [place],  [address]   Urine Drug Screen,  Qualitative     Status: None   Collection Time: 12/16/22  5:47 PM  Result Value Ref Range   Tricyclic, Ur Screen NONE DETECTED NONE DETECTED   Amphetamines, Ur Screen NONE DETECTED NONE DETECTED   MDMA (Ecstasy)Ur Screen NONE DETECTED NONE DETECTED   Cocaine Metabolite,Ur [place] NONE DETECTED NONE DETECTED   Opiate, Ur Screen NONE DETECTED NONE DETECTED   Phencyclidine (PCP) Ur S NONE DETECTED NONE DETECTED   Cannabinoid 50 Ng, Ur [place] NONE DETECTED NONE DETECTED   Barbiturates, Ur Screen NONE DETECTED  NONE DETECTED   Benzodiazepine, Ur Scrn NONE DETECTED NONE DETECTED   Methadone Scn, Ur NONE DETECTED NONE DETECTED    Comment: (NOTE) Tricyclics + metabolites, urine    Cutoff 1000 ng/mL Amphetamines + metabolites, urine  Cutoff 1000 ng/mL MDMA (Ecstasy), urine              Cutoff 500 ng/mL Cocaine Metabolite, urine          Cutoff 300 ng/mL Opiate + metabolites, urine        Cutoff 300 ng/mL Phencyclidine (PCP), urine         Cutoff 25 ng/mL Cannabinoid, urine                 Cutoff 50 ng/mL Barbiturates + metabolites, urine  Cutoff 200 ng/mL Benzodiazepine, urine              Cutoff 200 ng/mL Methadone, urine                   Cutoff 300 ng/mL  The urine drug screen provides only a preliminary, unconfirmed analytical test result and should not be used for non-medical purposes. Clinical consideration and professional judgment should be applied to any positive drug screen result due to possible interfering substances. A more specific alternate chemical method must be used in order to obtain a confirmed analytical result. Gas chromatography / mass spectrometry (GC/MS) is the preferred confirm atory method. Performed at [facility], [address] [place], [place] [address]   CBC with Diff     Status: None   Collection Time: 12/16/22  5:47 PM  Result Value Ref Range   WBC 7.3 4.0 - 10.5 K/uL   RBC 5.17 4.22 - 5.81 MIL/uL   Hemoglobin 15.1 13.0 - 17.0 g/dL   HCT 47.1 39.0  - 52.0 %   MCV 91.1 80.0 - 100.0 fL   MCH 29.2 26.0 - 34.0 pg   MCHC 32.1 30.0 - 36.0 g/dL   RDW 14.4 11.5 - 15.5 %   Platelets 244 150 - 400 K/uL   nRBC 0.0 0.0 - 0.2 %   Neutrophils Relative % 51 %   Neutro Abs 3.8 1.7 - 7.7 K/uL   Lymphocytes Relative 35 %   Lymphs Abs 2.6 0.7 - 4.0 K/uL   Monocytes Relative 10 %   Monocytes Absolute 0.7 0.1 - 1.0 K/uL   Eosinophils Relative 3 %   Eosinophils Absolute 0.2 0.0 - 0.5 K/uL   Basophils Relative 1 %   Basophils Absolute 0.1 0.0 - 0.1 K/uL   Immature Granulocytes 0 %   Abs Immature Granulocytes 0.01 0.00 - 0.07 K/uL    Comment: Performed at [facility], [address] [place], [place] [address]    Current Facility-Administered Medications  Medication Dose Route Frequency Provider Last Rate Last Admin   acetaminophen (TYLENOL) tablet 650 mg  650 mg Oral Q6H PRN Ward, Kristen N, DO   650 mg at 12/17/22 0042   ibuprofen (ADVIL) tablet 600 mg  600 mg Oral Q6H PRN Ward, Kristen N, DO   600 mg at 12/17/22 0042   QUEtiapine (SEROQUEL) tablet 50 mg  50 mg Oral QHS Duffy Bruce, MD       Current Outpatient Medications  Medication Sig Dispense Refill   HYDROcodone-acetaminophen (NORCO/VICODIN) 5-325 MG tablet Take one tab po q 4-6 hrs prn pain 15 tablet 0   ibuprofen (ADVIL,MOTRIN) 800 MG tablet Take 1 tablet (800 mg total) by mouth 3 (three) times daily.  Take with food 21 tablet 0   QUEtiapine (SEROQUEL) 50 MG tablet Take 1 tablet (50 mg total) by mouth at bedtime. 30 tablet 0    Musculoskeletal: Strength & Muscle Tone: within normal limits Gait & Station: normal Patient leans: N/A Psychiatric Specialty Exam:  Presentation  General Appearance:  Bizarre  Eye Contact: Fleeting  Speech: Clear and Coherent  Speech Volume: Normal  Handedness:No data recorded  Mood and Affect  Mood: Euthymic  Affect: Inappropriate   Thought Process  Thought Processes: Coherent  Descriptions of  Associations:Loose  Orientation:Full (Time, Place and Person)  Thought Content:Scattered  History of Schizophrenia/Schizoaffective disorder:No  Duration of Psychotic Symptoms:No data recorded Hallucinations:Hallucinations: None  Ideas of Reference:None  Suicidal Thoughts:Suicidal Thoughts: No  Homicidal Thoughts:Homicidal Thoughts: No   Sensorium  Memory: Immediate Fair; Recent Fair; Remote Fair  Judgment: Fair  Insight: Lacking   Executive Functions  Concentration: Fair  Attention Span: Fair  Recall: [employer] of Knowledge: Fair  Language: Fair   Psychomotor Activity  Psychomotor Activity: Psychomotor Activity: Normal   Assets  Assets: Communication Skills; Desire for Improvement; Resilience; Social Support   Sleep  Sleep: Sleep: Fair Number of Hours of Sleep: 6   Physical Exam: Physical Exam Vitals and nursing note reviewed.  Constitutional:      Appearance: Normal appearance. He is normal weight.  HENT:     Head: Normocephalic and atraumatic.     Right Ear: External ear normal.     Left Ear: External ear normal.     Nose: Nose normal.     Mouth/Throat:     Mouth: Mucous membranes are moist.  Cardiovascular:     Rate and Rhythm: Normal rate.     Pulses: Normal pulses.  Pulmonary:     Effort: Pulmonary effort is normal.  Musculoskeletal:        General: Normal range of motion.     Cervical back: Normal range of motion and neck supple.  Neurological:     General: No focal deficit present.     Mental Status: He is alert and oriented to person, place, and time.  Psychiatric:        Attention and Perception: Attention and perception normal.        Mood and Affect: Affect is inappropriate.        Speech: Speech is slurred.        Behavior: Behavior is cooperative.        Thought Content: Thought content includes homicidal and suicidal ideation.    Review of Systems  Psychiatric/Behavioral:  Positive for substance abuse and  suicidal ideas.   All other systems reviewed and are negative.  Blood pressure 126/84, pulse 100, temperature 98 F (36.7 C), temperature source Oral, resp. rate 18, SpO2 100 %. There is no height or weight on file to calculate BMI.  Treatment Plan Summary: Plan   The patient would remain under observation overnight and be reassessed in the morning.  Disposition: Supportive therapy provided about ongoing stressors. The patient would remain under observation overnight and be reassessed in the morning.  Caroline Sauger, NP 12/17/2022 12:50 AM

## 2022-12-17 NOTE — ED Provider Notes (Signed)
Patient has been seen and evaluated by psychiatry.  The Waldon Merl, nurse practitioner has informed me that patient's IVC has been rescinded and he may follow-up outpatient.  His symptoms have improved.  They attribute his presentation to alcohol use.  Does have underlying history of bipolar disorder and can follow-up with his provider at [place] [facility]   Patient currently calling his family due to ride home.  He is alert fully oriented.  Denies any ongoing thoughts point to harm self or others.  Appears well at this time and appropriate for discharge   Delman Kitten, MD 12/17/22 [phone]

## 2022-12-17 NOTE — Consult Note (Signed)
[facility] Psych ED Progress Note  12/17/2022 6:39 PM Ross Oconnell  MRN:  [id number]   Method of visit?: Face to Face    reassessment  Subjective: "I slept really good last night."  Patient is pleasant, calm, cooperative.  He speaks in coherent sentences.  Makes good eye contact.  He states that he slept really good last night with no dreams.  He denies any thought or intent of suicide or homicide.  Denies auditory or visual hallucinations or paranoia.  Perceptions appear to be normal.  Patient states that he wants to return to the health center in [place] and they do have prescribers for his medication, as well as mental health counseling.  Patient presents logical plan.  He asks for a short-term prescription for Seroquel.  Patient provided prescription for 15 days with instructions to follow-up with his local provider in [place].  Patient states he just recently got Medicaid, which will enable him to get the outpatient treatment that he needs.  Discussed alcohol use with medications.  Advised against use of alcohol.  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 [facility]) Active Problems:   Homicidal ideations   Suicidal ideations   Mania ([place])  Total Time spent with patient: 20 minutes  Past Psychiatric History: See previous  Past Medical History:  Past Medical History:  Diagnosis Date   C4 cervical fracture ([place]) 03/20/2013   Archie Endo 04/06/2013 (04/07/2013)   Chronic knee pain    Chronic lower back pain    Fall down steps 04/06/2013   Hip fracture ([place]) ?1969   "don't remember which side" (04/07/2013)   Lumbar herniated disc    Numbness and tingling of both legs    "sometimes" (04/07/2013)   Pneumonia fall 2013    Past Surgical History:  Procedure Laterality Date   HAND TENDON SURGERY Right ~ 2011   "had tendons reattached" (04/07/2013)   Family History:  Family History  Problem Relation Age of Onset   Hypertension Mother     Diabetes Mother    Family Psychiatric  History:  Social History:  Social History   Substance and Sexual Activity  Alcohol Use Yes   Alcohol/week: 2.0 standard drinks of alcohol   Types: 2 Cans of beer per week   Comment: 04/07/2013 "one weekend day/week"     Social History   Substance and Sexual Activity  Drug Use Not Currently    Social History   Socioeconomic History   Marital status: Single    Spouse name: Not on file   Number of children: Not on file   Years of education: Not on file   Highest education level: Not on file  Occupational History   Not on file  Tobacco Use   Smoking status: Every Day    Packs/day: 0.33    Years: 17.00    Total pack years: 5.61    Types: Cigarettes   Smokeless tobacco: Never   Tobacco comments:    04/07/2013 offered smoking cessation materials; pt declines  Substance and Sexual Activity   Alcohol use: Yes    Alcohol/week: 2.0 standard drinks of alcohol    Types: 2 Cans of beer per week    Comment: 04/07/2013 "one weekend day/week"   Drug use: Not Currently   Sexual activity: Not Currently  Other Topics Concern   Not on file  Social History Narrative   ** Merged History Encounter **       Social Determinants of Health   Financial  Resource Strain: Not on file  Food Insecurity: Not on file  Transportation Needs: Not on file  Physical Activity: Not on file  Stress: Not on file  Social Connections: Not on file    Sleep: Good  Appetite:  Good  Current Medications: No current facility-administered medications for this encounter.   Current Outpatient Medications  Medication Sig Dispense Refill   HYDROcodone-acetaminophen (NORCO/VICODIN) 5-325 MG tablet Take one tab po q 4-6 hrs prn pain 15 tablet 0   ibuprofen (ADVIL,MOTRIN) 800 MG tablet Take 1 tablet (800 mg total) by mouth 3 (three) times daily. Take with food 21 tablet 0   QUEtiapine (SEROQUEL) 50 MG tablet Take 1 tablet (50 mg total) by mouth at bedtime. 15 tablet 0     Lab Results:  Results for orders placed or performed during the hospital encounter of 12/16/22 (from the past 48 hour(s))  Comprehensive metabolic panel     Status: None   Collection Time: 12/16/22  5:47 PM  Result Value Ref Range   Sodium 142 135 - 145 mmol/L   Potassium 3.6 3.5 - 5.1 mmol/L   Chloride 108 98 - 111 mmol/L   CO2 25 22 - 32 mmol/L   Glucose, Bld 82 70 - 99 mg/dL    Comment: Glucose reference range applies only to samples taken after fasting for at least 8 hours.   BUN 9 6 - 20 mg/dL   Creatinine, Ser 0.34 0.61 - 1.24 mg/dL   Calcium 9.0 8.9 - 74.2 mg/dL   Total Protein 7.8 6.5 - 8.1 g/dL   Albumin 4.1 3.5 - 5.0 g/dL   AST 20 15 - 41 U/L   ALT 13 0 - 44 U/L   Alkaline Phosphatase 89 38 - 126 U/L   Total Bilirubin 0.9 0.3 - 1.2 mg/dL   GFR, Estimated >59 >56 mL/min    Comment: (NOTE) Calculated using the CKD-EPI Creatinine Equation (2021)    Anion gap 9 5 - 15    Comment: Performed at [facility], [address], [place], [place] [address]  Ethanol     Status: Abnormal   Collection Time: 12/16/22  5:47 PM  Result Value Ref Range   Alcohol, Ethyl (B) 217 (H) <10 mg/dL    Comment: (NOTE) Lowest detectable limit for serum alcohol is 10 mg/dL.  For medical purposes only. Performed at [facility], [address] [place], [place] [address]   Urine Drug Screen, Qualitative     Status: None   Collection Time: 12/16/22  5:47 PM  Result Value Ref Range   Tricyclic, Ur Screen NONE DETECTED NONE DETECTED   Amphetamines, Ur Screen NONE DETECTED NONE DETECTED   MDMA (Ecstasy)Ur Screen NONE DETECTED NONE DETECTED   Cocaine Metabolite,Ur [place] NONE DETECTED NONE DETECTED   Opiate, Ur Screen NONE DETECTED NONE DETECTED   Phencyclidine (PCP) Ur S NONE DETECTED NONE DETECTED   Cannabinoid 50 Ng, Ur [place] NONE DETECTED NONE DETECTED   Barbiturates, Ur Screen NONE DETECTED NONE DETECTED   Benzodiazepine, Ur Scrn NONE DETECTED NONE DETECTED   Methadone  Scn, Ur NONE DETECTED NONE DETECTED    Comment: (NOTE) Tricyclics + metabolites, urine    Cutoff 1000 ng/mL Amphetamines + metabolites, urine  Cutoff 1000 ng/mL MDMA (Ecstasy), urine              Cutoff 500 ng/mL Cocaine Metabolite, urine          Cutoff 300 ng/mL Opiate + metabolites, urine  Cutoff 300 ng/mL Phencyclidine (PCP), urine         Cutoff 25 ng/mL Cannabinoid, urine                 Cutoff 50 ng/mL Barbiturates + metabolites, urine  Cutoff 200 ng/mL Benzodiazepine, urine              Cutoff 200 ng/mL Methadone, urine                   Cutoff 300 ng/mL  The urine drug screen provides only a preliminary, unconfirmed analytical test result and should not be used for non-medical purposes. Clinical consideration and professional judgment should be applied to any positive drug screen result due to possible interfering substances. A more specific alternate chemical method must be used in order to obtain a confirmed analytical result. Gas chromatography / mass spectrometry (GC/MS) is the preferred confirm atory method. Performed at [facility], [address] [place], [place] [address]   CBC with Diff     Status: None   Collection Time: 12/16/22  5:47 PM  Result Value Ref Range   WBC 7.3 4.0 - 10.5 K/uL   RBC 5.17 4.22 - 5.81 MIL/uL   Hemoglobin 15.1 13.0 - 17.0 g/dL   HCT 16.6 06.3 - 01.6 %   MCV 91.1 80.0 - 100.0 fL   MCH 29.2 26.0 - 34.0 pg   MCHC 32.1 30.0 - 36.0 g/dL   RDW 01.0 93.2 - 35.5 %   Platelets 244 150 - 400 K/uL   nRBC 0.0 0.0 - 0.2 %   Neutrophils Relative % 51 %   Neutro Abs 3.8 1.7 - 7.7 K/uL   Lymphocytes Relative 35 %   Lymphs Abs 2.6 0.7 - 4.0 K/uL   Monocytes Relative 10 %   Monocytes Absolute 0.7 0.1 - 1.0 K/uL   Eosinophils Relative 3 %   Eosinophils Absolute 0.2 0.0 - 0.5 K/uL   Basophils Relative 1 %   Basophils Absolute 0.1 0.0 - 0.1 K/uL   Immature Granulocytes 0 %   Abs Immature Granulocytes 0.01 0.00 - 0.07 K/uL     Comment: Performed at [facility], [address] [place], [place] [address]    Blood Alcohol level:  Lab Results  Component Value Date   ETH 217 (H) 12/16/2022   ETH 325 (HH) 01/12/2021    Physical Findings: AIMS:  , ,  ,  ,    CIWA:    COWS:     Musculoskeletal: Strength & Muscle Tone: within normal limits Gait & Station: normal Patient leans: N/A  Psychiatric Specialty Exam:  Presentation  General Appearance:  Appropriate for Environment  Eye Contact: Good  Speech: Clear and Coherent  Speech Volume: Normal  Handedness:No data recorded  Mood and Affect  Mood: Euthymic  Affect: Appropriate; Congruent   Thought Process  Thought Processes: Coherent  Descriptions of Associations:Intact  Orientation:Full (Time, Place and Person)  Thought Content:WDL  History of Schizophrenia/Schizoaffective disorder:No  Duration of Psychotic Symptoms:No data recorded Hallucinations:Hallucinations: None  Ideas of Reference:None  Suicidal Thoughts:Suicidal Thoughts: No  Homicidal Thoughts:Homicidal Thoughts: No   Sensorium  Memory: Immediate Good; Recent Good  Judgment: Good  Insight: Good   Executive Functions  Concentration: Good  Attention Span: Good  Recall: Good  Fund of Knowledge: Fair  Language: Good   Psychomotor Activity  Psychomotor Activity: Psychomotor Activity: Normal   Assets  Assets: Communication Skills; Desire for Improvement; Resilience; Social Support   Sleep  Sleep: Sleep: Good Number of Hours of Sleep: 6    Physical Exam: Physical Exam Vitals and nursing note reviewed.  HENT:     Head: Normocephalic.     Nose: No congestion or rhinorrhea.  Cardiovascular:     Rate and Rhythm: Normal rate.  Pulmonary:     Effort: Pulmonary effort is normal.  Musculoskeletal:        General: Normal range of motion.     Cervical back: Normal range of motion.  Neurological:     Mental Status: He is  alert and oriented to person, place, and time.  Psychiatric:        Attention and Perception: Attention normal.        Mood and Affect: Mood normal.        Behavior: Behavior normal.        Thought Content: Thought content normal.        Cognition and Memory: Cognition normal.        Judgment: Judgment normal.    Review of Systems  Constitutional: Negative.   HENT: Negative.    Respiratory: Negative.    Musculoskeletal: Negative.   Psychiatric/Behavioral:  Positive for substance abuse (Alcohol). Negative for depression, hallucinations, memory loss and suicidal ideas. The patient is not nervous/anxious and does not have insomnia.    Blood pressure 104/72, pulse 88, temperature 98.7 F (37.1 C), temperature source Oral, resp. rate 16, SpO2 98 %. There is no height or weight on file to calculate BMI.  Treatment Plan Summary: Plan provided prescription for Seroquel 50 mg at bedtime #15.  Patient is to follow-up with outpatient provider, which she states is that health clinic in [place].  He plans to follow up from medication management and mental health treatment.  Patient will be discharged home.  Reviewed with EDP  Sherlon Handing, NP 12/17/2022, 6:39 PM

## 2023-10-05 ENCOUNTER — Encounter: Payer: Self-pay | Admitting: *Deleted

## 2024-04-05 ENCOUNTER — Encounter (INDEPENDENT_AMBULATORY_CARE_PROVIDER_SITE_OTHER): Payer: Self-pay | Admitting: *Deleted

## 2024-04-21 ENCOUNTER — Encounter (INDEPENDENT_AMBULATORY_CARE_PROVIDER_SITE_OTHER): Payer: Self-pay | Admitting: *Deleted

## 2024-10-24 ENCOUNTER — Encounter (INDEPENDENT_AMBULATORY_CARE_PROVIDER_SITE_OTHER): Payer: Self-pay | Admitting: *Deleted
